# Patient Record
Sex: Female | Born: 1990 | Race: White | Hispanic: No | Marital: Married | Smoking: Former smoker
Health system: Southern US, Community
[De-identification: ages and names within clinical notes are randomized; demographics above are authoritative.]

## PROBLEM LIST (undated history)

## (undated) DIAGNOSIS — F603 Borderline personality disorder: Secondary | ICD-10-CM

## (undated) DIAGNOSIS — N200 Calculus of kidney: Secondary | ICD-10-CM

## (undated) DIAGNOSIS — F99 Mental disorder, not otherwise specified: Secondary | ICD-10-CM

## (undated) DIAGNOSIS — F329 Major depressive disorder, single episode, unspecified: Secondary | ICD-10-CM

## (undated) DIAGNOSIS — F32A Depression, unspecified: Secondary | ICD-10-CM

## (undated) DIAGNOSIS — F419 Anxiety disorder, unspecified: Secondary | ICD-10-CM

## (undated) DIAGNOSIS — R87629 Unspecified abnormal cytological findings in specimens from vagina: Secondary | ICD-10-CM

## (undated) HISTORY — PX: TUBAL LIGATION: SHX77

## (undated) HISTORY — DX: Anxiety disorder, unspecified: F41.9

## (undated) HISTORY — DX: Depression, unspecified: F32.A

## (undated) HISTORY — DX: Major depressive disorder, single episode, unspecified: F32.9

## (undated) HISTORY — DX: Unspecified abnormal cytological findings in specimens from vagina: R87.629

## (undated) HISTORY — PX: APPENDECTOMY: SHX54

## (undated) HISTORY — DX: Mental disorder, not otherwise specified: F99

## (undated) HISTORY — PX: OVARIAN CYST REMOVAL: SHX89

## (undated) HISTORY — PX: LEEP: SHX91

---
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
## 2013-12-11 ENCOUNTER — Emergency Department (HOSPITAL_COMMUNITY)
Admission: EM | Admit: 2013-12-11 | Discharge: 2013-12-11 | Disposition: A | Discharge status: Home / Self Care | Payer: Medicaid Other | Attending: Emergency Medicine | Admitting: Emergency Medicine

## 2013-12-11 ENCOUNTER — Encounter (HOSPITAL_COMMUNITY): Payer: Self-pay | Admitting: Emergency Medicine

## 2013-12-11 DIAGNOSIS — F17200 Nicotine dependence, unspecified, uncomplicated: Secondary | ICD-10-CM | POA: Diagnosis not present

## 2013-12-11 DIAGNOSIS — J029 Acute pharyngitis, unspecified: Secondary | ICD-10-CM | POA: Insufficient documentation

## 2013-12-11 DIAGNOSIS — Z8742 Personal history of other diseases of the female genital tract: Secondary | ICD-10-CM | POA: Insufficient documentation

## 2013-12-11 DIAGNOSIS — J069 Acute upper respiratory infection, unspecified: Secondary | ICD-10-CM | POA: Diagnosis not present

## 2013-12-11 LAB — RAPID STREP SCREEN (MED CTR MEBANE ONLY): Streptococcus, Group A Screen (Direct): NEGATIVE

## 2013-12-11 MED ORDER — IBUPROFEN 600 MG PO TABS: 600.0000 mg | ORAL_TABLET | Freq: Four times a day (QID) | ORAL | Status: DC | PRN

## 2013-12-11 MED ORDER — BENZONATATE 200 MG PO CAPS: 200.0000 mg | ORAL_CAPSULE | Freq: Three times a day (TID) | ORAL | Status: DC | PRN

## 2013-12-11 NOTE — ED Provider Notes (Signed)
CSN: [id number]     Arrival date & time 12/11/13  1547 History    Chief Complaint  Patient presents with  . Sore Throat     HPI HPI Comments: Darlene Mcbride is a 23 y.o. female who presents to the Emergency Department complaining of sore throat for the past 3 days which has progressed to include subjective fevers, nasal congestion with thick nasal discharge from clear to green along with post nasal drip with need for frequent expectoration.  She has had a dry sounding cough and denies sob, chest pain, wheezing.  She has had subjective fever as well.  She has taken dayquill and Nyquil with some transient relief of symptoms. Her infant son also has similar symptoms. To her knowledge she has not been exposed to anyone with strep throat.  History reviewed. No pertinent past medical history. Past Surgical History  Procedure Laterality Date  . Appendectomy    . Ovarian cyst removal     No family history on file. History  Substance Use Topics  . Smoking status: Current Some Day Smoker  . Smokeless tobacco: Not on file  . Alcohol Use: No   OB History   Grav Para Term Preterm Abortions TAB SAB Ect Mult Living                 Review of Systems  Constitutional: Positive for fever and chills.  HENT: Positive for congestion, postnasal drip, rhinorrhea, sinus pressure and sore throat. Negative for ear pain, trouble swallowing and voice change.   Eyes: Negative for discharge.  Respiratory: Positive for cough. Negative for shortness of breath, wheezing and stridor.   Cardiovascular: Negative for chest pain.  Gastrointestinal: Negative for nausea and abdominal pain.  Genitourinary: Negative.       Allergies  Review of patient's allergies indicates no known allergies.  Home Medications   Prior to Admission medications   Medication Sig Start Date End Date Taking? Authorizing Provider  Pseudoephedrine-APAP-DM (DAYQUIL MULTI-SYMPTOM COLD/FLU PO) Take 2 capsules by mouth every 6 (six) hours  as needed. Cold sx   Yes Historical Provider, MD  benzonatate (TESSALON) 200 MG capsule Take 1 capsule (200 mg total) by mouth 3 (three) times daily as needed for cough. 12/11/13   Evalee Jefferson, PA-C  ibuprofen (ADVIL,MOTRIN) 600 MG tablet Take 1 tablet (600 mg total) by mouth every 6 (six) hours as needed (throat pain). 12/11/13   Evalee Jefferson, PA-C   Triage Vitals: BP 136/74  Pulse 99  Temp(Src) 97.9 F (36.6 C)  Resp 16  Ht 5' 4.5" (1.638 m)  Wt 235 lb (106.595 kg)  BMI 39.73 kg/m2  SpO2 99%  LMP 12/04/2013 Physical Exam  Constitutional: She is oriented to person, place, and time. She appears well-developed and well-nourished.  HENT:  Head: Normocephalic and atraumatic.  Right Ear: Tympanic membrane and ear canal normal.  Left Ear: Tympanic membrane and ear canal normal.  Nose: Mucosal edema and rhinorrhea present. Right sinus exhibits no maxillary sinus tenderness and no frontal sinus tenderness. Left sinus exhibits no maxillary sinus tenderness and no frontal sinus tenderness.  Mouth/Throat: Uvula is midline and mucous membranes are normal. Posterior oropharyngeal erythema present. No oropharyngeal exudate, posterior oropharyngeal edema or tonsillar abscesses.  Petechiae on soft palate.  Eyes: Conjunctivae are normal.  Cardiovascular: Normal rate and normal heart sounds.   Pulmonary/Chest: Effort normal. No respiratory distress. She has no wheezes. She has no rales.  Abdominal: Soft. There is no tenderness.  Musculoskeletal: Normal range of motion.  Neurological: She is alert and oriented to person, place, and time.  Skin: Skin is warm and dry. No rash noted.  Psychiatric: She has a normal mood and affect.    ED Course  Procedures (including critical care time)  COORDINATION OF CARE: 6:14 PM- Discussed treatment plan with patient at bedside and patient agreed to plan.    Labs Review Labs Reviewed  RAPID STREP SCREEN  CULTURE, GROUP A STREP    Imaging Review No results  found.   EKG Interpretation None      MDM   Final diagnoses:  Acute URI    Patients labs and/or radiological studies were viewed and considered during the medical decision making and disposition process. Strep negative.  Pt advised rest,  Increased fluid intake, ibuprofen, tessalon.  Prn f/u with pcp.  I personally performed the services described in this documentation, which was scribed in my presence. The recorded information has been reviewed and is accurate.    Evalee Jefferson, PA-C 12/11/13 1818

## 2013-12-11 NOTE — ED Notes (Signed)
Pt c/o sore throat, nasal congestion, cough that is productive at times, unsure of color to mucous not feeling well, chills  Since Thursday,

## 2013-12-11 NOTE — Discharge Instructions (Signed)
Upper Respiratory Infection, Adult An upper respiratory infection (URI) is also sometimes known as the common cold. The upper respiratory tract includes the nose, sinuses, throat, trachea, and bronchi. Bronchi are the airways leading to the lungs. Most people improve within 1 week, but symptoms can last up to 2 weeks. A residual cough may last even longer.  CAUSES Many different viruses can infect the tissues lining the upper respiratory tract. The tissues become irritated and inflamed and often become very moist. Mucus production is also common. A cold is contagious. You can easily spread the virus to others by oral contact. This includes kissing, sharing a glass, coughing, or sneezing. Touching your mouth or nose and then touching a surface, which is then touched by another person, can also spread the virus. SYMPTOMS  Symptoms typically develop 1 to 3 days after you come in contact with a cold virus. Symptoms vary from person to person. They may include:  Runny nose.  Sneezing.  Nasal congestion.  Sinus irritation.  Sore throat.  Loss of voice (laryngitis).  Cough.  Fatigue.  Muscle aches.  Loss of appetite.  Headache.  Low-grade fever. DIAGNOSIS  You might diagnose your own cold based on familiar symptoms, since most people get a cold 2 to 3 times a year. Your caregiver can confirm this based on your exam. Most importantly, your caregiver can check that your symptoms are not due to another disease such as strep throat, sinusitis, pneumonia, asthma, or epiglottitis. Blood tests, throat tests, and X-rays are not necessary to diagnose a common cold, but they may sometimes be helpful in excluding other more serious diseases. Your caregiver will decide if any further tests are required. RISKS AND COMPLICATIONS  You may be at risk for a more severe case of the common cold if you smoke cigarettes, have chronic heart disease (such as heart failure) or lung disease (such as asthma), or if  you have a weakened immune system. The very young and very old are also at risk for more serious infections. Bacterial sinusitis, middle ear infections, and bacterial pneumonia can complicate the common cold. The common cold can worsen asthma and chronic obstructive pulmonary disease (COPD). Sometimes, these complications can require emergency medical care and may be life-threatening. PREVENTION  The best way to protect against getting a cold is to practice good hygiene. Avoid oral or hand contact with people with cold symptoms. Wash your hands often if contact occurs. There is no clear evidence that vitamin C, vitamin E, echinacea, or exercise reduces the chance of developing a cold. However, it is always recommended to get plenty of rest and practice good nutrition. TREATMENT  Treatment is directed at relieving symptoms. There is no cure. Antibiotics are not effective, because the infection is caused by a virus, not by bacteria. Treatment may include:  Increased fluid intake. Sports drinks offer valuable electrolytes, sugars, and fluids.  Breathing heated mist or steam (vaporizer or shower).  Eating chicken soup or other clear broths, and maintaining good nutrition.  Getting plenty of rest.  Using gargles or lozenges for comfort.  Controlling fevers with ibuprofen or acetaminophen as directed by your caregiver.  Increasing usage of your inhaler if you have asthma. Zinc gel and zinc lozenges, taken in the first 24 hours of the common cold, can shorten the duration and lessen the severity of symptoms. Pain medicines may help with fever, muscle aches, and throat pain. A variety of non-prescription medicines are available to treat congestion and runny nose. Your caregiver   can make recommendations and may suggest nasal or lung inhalers for other symptoms.  HOME CARE INSTRUCTIONS   Only take over-the-counter or prescription medicines for pain, discomfort, or fever as directed by your  caregiver.  Use a warm mist humidifier or inhale steam from a shower to increase air moisture. This may keep secretions moist and make it easier to breathe.  Drink enough water and fluids to keep your urine clear or pale yellow.  Rest as needed.  Return to work when your temperature has returned to normal or as your caregiver advises. You may need to stay home longer to avoid infecting others. You can also use a face mask and careful hand washing to prevent spread of the virus. SEEK MEDICAL CARE IF:   After the first few days, you feel you are getting worse rather than better.  You need your caregiver's advice about medicines to control symptoms.  You develop chills, worsening shortness of breath, or brown or red sputum. These may be signs of pneumonia.  You develop yellow or brown nasal discharge or pain in the face, especially when you bend forward. These may be signs of sinusitis.  You develop a fever, swollen neck glands, pain with swallowing, or white areas in the back of your throat. These may be signs of strep throat. SEEK IMMEDIATE MEDICAL CARE IF:   You have a fever.  You develop severe or persistent headache, ear pain, sinus pain, or chest pain.  You develop wheezing, a prolonged cough, cough up blood, or have a change in your usual mucus (if you have chronic lung disease).  You develop sore muscles or a stiff neck. Document Released: 10/22/2000 Document Revised: 07/21/2011 Document Reviewed: 08/03/2013 Exit[employer] Patient Information 2015 [employer]. This information is not intended to replace advice given to you by your health care provider. Make sure you discuss any questions you have with your health care provider.  

## 2013-12-12 NOTE — ED Provider Notes (Signed)
Medical screening examination/treatment/procedure(s) were performed by non-physician practitioner and as supervising physician I was immediately available for consultation/collaboration.   EKG Interpretation None       Babette Relic, MD 12/12/13 1210

## 2013-12-14 LAB — CULTURE, GROUP A STREP

## 2013-12-15 ENCOUNTER — Telehealth (HOSPITAL_BASED_OUTPATIENT_CLINIC_OR_DEPARTMENT_OTHER): Payer: Self-pay | Admitting: Emergency Medicine

## 2013-12-15 NOTE — Telephone Encounter (Signed)
Post ED Visit - Positive Culture Follow-up: Successful Patient Follow-Up  Culture assessed and recommendations reviewed by: []  [facility] [place], Pharm.D., BCPS []  Heide Guile, Pharm.D., BCPS []  Alycia Rossetti, Pharm.D., BCPS []  [place], Pharm.D., BCPS, AAHIVP []  Legrand Como, Pharm.D., BCPS, AAHIVP []  Hassie Bruce, Pharm.D. [x]  Milus Glazier, Pharm.D.  Positive strep throat culture  [x]  Patient discharged without antimicrobial prescription and treatment is now indicated []  Organism is resistant to prescribed ED discharge antimicrobial []  Patient with positive blood cultures  Changes discussed with ED provider: [facility] New antibiotic prescription Amoxicillin 500mg  po bid x 10 days Called to [place] patient, date 12/15/13, time 1538   Hazle Nordmann 12/15/2013, 3:37 PM

## 2013-12-15 NOTE — Progress Notes (Signed)
ED Antimicrobial Stewardship Positive Culture Follow Up   Darlene Mcbride is an 23 y.o. female who presented to [facility] on 12/11/2013 with a chief complaint of  Chief Complaint  Patient presents with  . Sore Throat    Recent Results (from the past 720 hour(s))  RAPID STREP SCREEN     Status: None   Collection Time    12/11/13  5:15 PM      Result Value Ref Range Status   Streptococcus, Group A Screen (Direct) NEGATIVE  NEGATIVE Final   Comment: (NOTE)     A Rapid Antigen test may result negative if the antigen level in the     sample is below the detection level of this test. The FDA has not     cleared this test as a stand-alone test therefore the rapid antigen     negative result has reflexed to a Group A Strep culture.  CULTURE, GROUP A STREP     Status: None   Collection Time    12/11/13  5:15 PM      Result Value Ref Range Status   Specimen Description THROAT   Final   Special Requests NONE   Final   Culture     Final   Value: GROUP A STREP (S.PYOGENES) ISOLATED     Performed at [facility]   Report Status 12/14/2013 FINAL   Final    [x]  Patient discharged originally without antimicrobial agent and treatment is now indicated  New antibiotic prescription: amoxicillin 500 mg PO BID x 10 days.  ED Provider: Delos Haring, PA-C   Nicole Kindred, Gillian Shields 12/15/2013, 11:53 AM Infectious Diseases Pharmacist Phone# [phone]

## 2014-07-23 ENCOUNTER — Emergency Department (HOSPITAL_COMMUNITY)
Admission: EM | Admit: 2014-07-23 | Discharge: 2014-07-24 | Disposition: A | Discharge status: Home / Self Care | Payer: Self-pay | Attending: Emergency Medicine | Admitting: Emergency Medicine

## 2014-07-23 ENCOUNTER — Emergency Department (HOSPITAL_COMMUNITY): Payer: Medicaid Other

## 2014-07-23 ENCOUNTER — Encounter (HOSPITAL_COMMUNITY): Payer: Self-pay | Admitting: Emergency Medicine

## 2014-07-23 DIAGNOSIS — Z3490 Encounter for supervision of normal pregnancy, unspecified, unspecified trimester: Secondary | ICD-10-CM

## 2014-07-23 DIAGNOSIS — N390 Urinary tract infection, site not specified: Secondary | ICD-10-CM

## 2014-07-23 DIAGNOSIS — O9989 Other specified diseases and conditions complicating pregnancy, childbirth and the puerperium: Secondary | ICD-10-CM | POA: Insufficient documentation

## 2014-07-23 DIAGNOSIS — Z7952 Long term (current) use of systemic steroids: Secondary | ICD-10-CM | POA: Insufficient documentation

## 2014-07-23 DIAGNOSIS — Z3A00 Weeks of gestation of pregnancy not specified: Secondary | ICD-10-CM | POA: Insufficient documentation

## 2014-07-23 DIAGNOSIS — Z720 Tobacco use: Secondary | ICD-10-CM | POA: Insufficient documentation

## 2014-07-23 DIAGNOSIS — R42 Dizziness and giddiness: Secondary | ICD-10-CM | POA: Insufficient documentation

## 2014-07-23 DIAGNOSIS — R1031 Right lower quadrant pain: Secondary | ICD-10-CM | POA: Insufficient documentation

## 2014-07-23 DIAGNOSIS — O2341 Unspecified infection of urinary tract in pregnancy, first trimester: Secondary | ICD-10-CM | POA: Insufficient documentation

## 2014-07-23 DIAGNOSIS — R51 Headache: Secondary | ICD-10-CM | POA: Insufficient documentation

## 2014-07-23 DIAGNOSIS — Z9049 Acquired absence of other specified parts of digestive tract: Secondary | ICD-10-CM | POA: Insufficient documentation

## 2014-07-23 LAB — COMPREHENSIVE METABOLIC PANEL
ALBUMIN: 3.9 g/dL (ref 3.5–5.2)
ALK PHOS: 58 U/L (ref 39–117)
ALT: 20 U/L (ref 0–35)
ANION GAP: 6 (ref 5–15)
AST: 17 U/L (ref 0–37)
BUN: 10 mg/dL (ref 6–23)
CALCIUM: 8.8 mg/dL (ref 8.4–10.5)
CO2: 23 mmol/L (ref 19–32)
CREATININE: 0.53 mg/dL (ref 0.50–1.10)
Chloride: 107 mmol/L (ref 96–112)
GFR calc Af Amer: >90 mL/min (ref >90)
GLUCOSE: 93 mg/dL (ref 70–99)
POTASSIUM: 3.5 mmol/L (ref 3.5–5.1)
Sodium: 136 mmol/L (ref 135–145)
TOTAL PROTEIN: 7.2 g/dL (ref 6.0–8.3)
Total Bilirubin: 0.3 mg/dL (ref 0.3–1.2)

## 2014-07-23 LAB — URINALYSIS, ROUTINE W REFLEX MICROSCOPIC
BILIRUBIN URINE: NEGATIVE
GLUCOSE, UA: NEGATIVE mg/dL
HGB URINE DIPSTICK: NEGATIVE
Ketones, ur: NEGATIVE mg/dL
LEUKOCYTES UA: NEGATIVE
NITRITE: POSITIVE — AB
PH: 6.5 (ref 5.0–8.0)
PROTEIN: NEGATIVE mg/dL
Urobilinogen, UA: 0.2 mg/dL (ref 0.0–1.0)

## 2014-07-23 LAB — CBC WITH DIFFERENTIAL/PLATELET
BASOS ABS: 0 10*3/uL (ref 0.0–0.1)
Basophils Relative: 0 % (ref 0–1)
Eosinophils Absolute: 0.4 10*3/uL (ref 0.0–0.7)
Eosinophils Relative: 3 % (ref 0–5)
HEMATOCRIT: 36.3 % (ref 36.0–46.0)
Hemoglobin: 12.2 g/dL (ref 12.0–15.0)
LYMPHS ABS: 4.3 10*3/uL — AB (ref 0.7–4.0)
LYMPHS PCT: 31 % (ref 12–46)
MCH: 31.5 pg (ref 26.0–34.0)
MCHC: 33.6 g/dL (ref 30.0–36.0)
MCV: 93.8 fL (ref 78.0–100.0)
Monocytes Absolute: 0.9 10*3/uL (ref 0.1–1.0)
Monocytes Relative: 6 % (ref 3–12)
NEUTROS ABS: 8.3 10*3/uL — AB (ref 1.7–7.7)
NEUTROS PCT: 60 % (ref 43–77)
Platelets: 336 10*3/uL (ref 150–400)
RBC: 3.87 MIL/uL (ref 3.87–5.11)
RDW: 14.3 % (ref 11.5–15.5)
WBC: 13.9 10*3/uL — AB (ref 4.0–10.5)

## 2014-07-23 LAB — URINE MICROSCOPIC-ADD ON

## 2014-07-23 LAB — PREGNANCY, URINE: PREG TEST UR: POSITIVE — AB

## 2014-07-23 LAB — HCG, QUANTITATIVE, PREGNANCY: HCG, BETA CHAIN, QUANT, S: 135 m[IU]/mL — AB (ref <5)

## 2014-07-23 MED ORDER — ACETAMINOPHEN 325 MG PO TABS
650.0000 mg | ORAL_TABLET | Freq: Once | ORAL | Status: AC
  Administered 2014-07-23: 650 mg via ORAL
  Filled 2014-07-23: qty 2

## 2014-07-23 MED ORDER — CEPHALEXIN 500 MG PO CAPS: 500.0000 mg | ORAL_CAPSULE | Freq: Two times a day (BID) | ORAL | Status: DC

## 2014-07-23 NOTE — ED Notes (Signed)
Pt denies any bleeding or vaginal discharge.

## 2014-07-23 NOTE — ED Provider Notes (Addendum)
CSN: [id number]     Arrival date & time 07/23/14  7619 History   This chart was scribed for Elnora Morrison, MD by Chester Holstein, ED Scribe. This patient was seen in room APA16A/APA16A and the patient's care was started at 7:31 PM.    Chief Complaint  Patient presents with  . Abdominal Pain     Patient is a 24 y.o. female presenting with abdominal pain. The history is provided by the patient. No language interpreter was used.  Abdominal Pain Associated symptoms: no vaginal bleeding    HPI Comments: Darlene Mcbride is a 24 y.o. female who presents to the Emergency Department complaining of stabbing RLQ abdominal pain with onset a week ago. Pt notes dizziness and headache. Pt with h/o appendectomy, ovarian cyst removal, and renal calculi. She denies similar pain in past.  Pt states last menstrual period was 06/21/14. Pt states she took a home pregnancy test 2 days ago with positive results but she is unsure of how many weeks along. Pt is G3 P2. Pt denies flank pain and vaginal bleeding.   History reviewed. No pertinent past medical history. Past Surgical History  Procedure Laterality Date  . Appendectomy    . Ovarian cyst removal     No family history on file. History  Substance Use Topics  . Smoking status: Current Some Day Smoker  . Smokeless tobacco: Not on file  . Alcohol Use: No   OB History    Gravida Para Term Preterm AB TAB SAB Ectopic Multiple Living   1              Review of Systems  Gastrointestinal: Positive for abdominal pain.  Genitourinary: Negative for flank pain and vaginal bleeding.  Neurological: Positive for dizziness and headaches.  All other systems reviewed and are negative.     Allergies  Review of patient's allergies indicates no known allergies.  Home Medications   Prior to Admission medications   Medication Sig Start Date End Date Taking? Authorizing Provider  flintstones complete (FLINTSTONES) 60 MG chewable tablet Chew 1 tablet by mouth  daily.   Yes Historical Provider, MD  benzonatate (TESSALON) 200 MG capsule Take 1 capsule (200 mg total) by mouth 3 (three) times daily as needed for cough. Patient not taking: Reported on 07/23/2014 12/11/13   Evalee Jefferson, PA-C  cephALEXin (KEFLEX) 500 MG capsule Take 1 capsule (500 mg total) by mouth 2 (two) times daily. 07/23/14   Elnora Morrison, MD  ibuprofen (ADVIL,MOTRIN) 600 MG tablet Take 1 tablet (600 mg total) by mouth every 6 (six) hours as needed (throat pain). Patient not taking: Reported on 07/23/2014 12/11/13   Evalee Jefferson, PA-C   BP 131/66 mmHg  Pulse 86  Temp(Src) 98.1 F (36.7 C) (Oral)  Resp 24  Ht 5\' 4"  (1.626 m)  Wt 229 lb 8 oz (104.101 kg)  BMI 39.37 kg/m2  SpO2 100%  LMP 06/21/2014 (Approximate) Physical Exam  Constitutional: She is oriented to person, place, and time. She appears well-developed and well-nourished.  HENT:  Head: Normocephalic.  Eyes: Conjunctivae are normal.  Neck: Normal range of motion. Neck supple.  Pulmonary/Chest: Effort normal.  Abdominal: There is tenderness in the suprapubic area. There is no rigidity and no guarding.  No peritonitis  Musculoskeletal: Normal range of motion.  Neurological: She is alert and oriented to person, place, and time.  Skin: Skin is warm and dry.  Psychiatric: She has a normal mood and affect. Her behavior is normal.  Nursing note  and vitals reviewed.   ED Course  Procedures (including critical care time) Emergency Focused Ultrasound Exam Limited retroperitoneal ultrasound of kidneys  Performed and interpreted by Dr. Reather Converse Indication: flank pain Focused abdominal ultrasound with both kidneys imaged in transverse and longitudinal planes in real-time. Interpretation: No hydronephrosis visualized.   Images archived electronically  EMERGENCY DEPARTMENT [place] PREGNANCY "Study: Limited Ultrasound of the Pelvis for Pregnancy"  INDICATIONS:Pregnancy(required) Multiple views of the uterus and pelvic cavity were  obtained in real-time with a multi-frequency probe.  APPROACH:Transabdominal   PERFORMED BY: Myself  IMAGES ARCHIVED?: Yes  LIMITATIONS: Body habitus  PREGNANCY FREE FLUID: None  PREGNANCY FINDINGS: No yolk sac noted and No fetal pole seen  INTERPRETATION: No visualized intrauterine pregnancy      DIAGNOSTIC STUDIES: Oxygen Saturation is 100% on room air, normal by my interpretation.    COORDINATION OF CARE: 7:42 PM Discussed treatment plan with patient at beside, the patient agrees with the plan and has no further questions at this time.   Labs Review Labs Reviewed  URINALYSIS, ROUTINE W REFLEX MICROSCOPIC - Abnormal; Notable for the following:    APPearance HAZY (*)    Specific Gravity, Urine >1.030 (*)    Nitrite POSITIVE (*)    All other components within normal limits  CBC WITH DIFFERENTIAL/PLATELET - Abnormal; Notable for the following:    WBC 13.9 (*)    Neutro Abs 8.3 (*)    Lymphs Abs 4.3 (*)    All other components within normal limits  HCG, QUANTITATIVE, PREGNANCY - Abnormal; Notable for the following:    hCG, Beta Chain, Quant, S 135 (*)    All other components within normal limits  PREGNANCY, URINE - Abnormal; Notable for the following:    Preg Test, Ur POSITIVE (*)    All other components within normal limits  URINE MICROSCOPIC-ADD ON - Abnormal; Notable for the following:    Squamous Epithelial / LPF FEW (*)    Bacteria, UA MANY (*)    All other components within normal limits  URINE CULTURE  COMPREHENSIVE METABOLIC PANEL    Imaging Review US Ob Comp Less 14 Wks  07/23/2014   CLINICAL DATA:  Right lower quadrant pain. Positive pregnancy test. No vaginal bleeding. Quantitative beta HCG unknown. Estimated gestational age per LMP 4 weeks 4 days.  EXAM: OBSTETRIC <14 WK [place] AND TRANSVAGINAL OB US  TECHNIQUE: Both transabdominal and transvaginal ultrasound examinations were performed for complete evaluation of the gestation as well as the maternal  uterus, adnexal regions, and pelvic cul-de-sac. Transvaginal technique was performed to assess early pregnancy.  COMPARISON:  None.  FINDINGS: Intrauterine gestational sac: Not visualized.  Yolk sac:  Not visualized.  Embryo:  Not visualized.  Cardiac Activity: Not visualized.  Heart Rate: Not visualized.  Maternal uterus/adnexae: Uterus is normal in size, shape position. Endometrium measures approximately 11 mm in thickness containing a mild amount of minimally complex fluid. Ovaries are are within normal with normal vascular flow. No significant free pelvic fluid.  IMPRESSION: No evidence of intrauterine gestation. Small amount of complex fluid over the uterine cavity. No other findings to suggest ectopic pregnancy. Recommend correlation with patient's quantitative beta HCG as well as serial quantitative beta HCG and follow-up ultrasound as clinically indicated.   Electronically Signed   By: Marin Olp M.D.   On: 07/23/2014 21:20   US Ob Transvaginal  07/23/2014   CLINICAL DATA:  Right lower quadrant pain. Positive pregnancy test. No vaginal bleeding. Quantitative beta HCG unknown. Estimated gestational  age per LMP 4 weeks 4 days.  EXAM: OBSTETRIC <14 WK [place] AND TRANSVAGINAL OB US  TECHNIQUE: Both transabdominal and transvaginal ultrasound examinations were performed for complete evaluation of the gestation as well as the maternal uterus, adnexal regions, and pelvic cul-de-sac. Transvaginal technique was performed to assess early pregnancy.  COMPARISON:  None.  FINDINGS: Intrauterine gestational sac: Not visualized.  Yolk sac:  Not visualized.  Embryo:  Not visualized.  Cardiac Activity: Not visualized.  Heart Rate: Not visualized.  Maternal uterus/adnexae: Uterus is normal in size, shape position. Endometrium measures approximately 11 mm in thickness containing a mild amount of minimally complex fluid. Ovaries are are within normal with normal vascular flow. No significant free pelvic fluid.  IMPRESSION: No  evidence of intrauterine gestation. Small amount of complex fluid over the uterine cavity. No other findings to suggest ectopic pregnancy. Recommend correlation with patient's quantitative beta HCG as well as serial quantitative beta HCG and follow-up ultrasound as clinically indicated.   Electronically Signed   By: Marin Olp M.D.   On: 07/23/2014 21:20     EKG Interpretation None      7:38 PM No intrauterine pregnancy seen with bedside US  MDM   Final diagnoses:  Abdominal pain, right lower quadrant  Pregnant  UTI (lower urinary tract infection)   Patient with new onset pregnancy unsure last menstrual cycle irregularity presents with abdominal pain right lower quadrant intermittent sharp and recent pregnancy. Bedside ultrasound unable to identify intrauterine pregnancy. Benign abdominal exam without peritonitis. Patient has had her appendix removed in the past. No vaginal symptoms no vaginal bleeding. Patient has had kidney stones before, no hydronephrosis seen, urinalysis pending. Tylenol for pain  Ultrasound tech called in for formal transvaginal ultrasound. Likely UTI, keflex and outpt fup Reviewed radiology results, no intrauterine pregnancy, no obvious ectopic pregnancy at this time. Patient's vitals stable, hemoglobin stable, well-appearing, patient stable for close outpatient follow-up in 2 days with OB/GYN for reassessment. Results and differential diagnosis were discussed with the patient/parent/guardian. Close follow up outpatient was discussed, comfortable with the plan.   Medications  acetaminophen (TYLENOL) tablet 650 mg (650 mg Oral Given 07/23/14 1946)    Filed Vitals:   07/23/14 1859  BP: 131/66  Pulse: 86  Temp: 98.1 F (36.7 C)  TempSrc: Oral  Resp: 24  Height: 5\' 4"  (1.626 m)  Weight: 229 lb 8 oz (104.101 kg)  SpO2: 100%    Final diagnoses:  Abdominal pain, right lower quadrant  Pregnant  UTI (lower urinary tract infection)      Elnora Morrison,  MD 07/23/14 2158  Elnora Morrison, MD 07/23/14 2216

## 2014-07-23 NOTE — ED Notes (Addendum)
Patient c/o right lower abd pain x6 days with nausea, dizziness, and headaches. Patient states she took pregnancy test Friday was positive. Per patient last period 06/21/2014. Denies any bleeding. Per patient has 2 prior pregnancy with no complications and patient states "I had lower abd pain with them but not like this."

## 2014-07-23 NOTE — Discharge Instructions (Signed)
If you were given medicines take as directed.  If you are on coumadin or contraceptives realize their levels and effectiveness is altered by many different medicines.  If you have any reaction (rash, tongues swelling, other) to the medicines stop taking and see a physician.   It is very important for you to follow-up with OB/GYN Monday or Tuesday for further evaluation of abnormal pregnancy. If you develop vaginal bleeding, passing out, worsening pain or other symptoms return to the ER if not follow closely with obstetrics. Please follow up as directed and return to the ER or see a physician for new or worsening symptoms such as fever, bleeding, other.  Thank you. Filed Vitals:   07/23/14 1859  BP: 131/66  Pulse: 86  Temp: 98.1 F (36.7 C)  TempSrc: Oral  Resp: 24  Height: 5\' 4"  (1.626 m)  Weight: 229 lb 8 oz (104.101 kg)  SpO2: 100%    Abdominal Pain During Pregnancy Abdominal pain is common in pregnancy. Most of the time, it does not cause harm. There are many causes of abdominal pain. Some causes are more serious than others. Some of the causes of abdominal pain in pregnancy are easily diagnosed. Occasionally, the diagnosis takes time to understand. Other times, the cause is not determined. Abdominal pain can be a sign that something is very wrong with the pregnancy, or the pain may have nothing to do with the pregnancy at all. For this reason, always tell your health care provider if you have any abdominal discomfort. HOME CARE INSTRUCTIONS  Monitor your abdominal pain for any changes. The following actions may help to alleviate any discomfort you are experiencing:  Do not have sexual intercourse or put anything in your vagina until your symptoms go away completely.  Get plenty of rest until your pain improves.  Drink clear fluids if you feel nauseous. Avoid solid food as long as you are uncomfortable or nauseous.  Only take over-the-counter or prescription medicine as directed by  your health care provider.  Keep all follow-up appointments with your health care provider. SEEK IMMEDIATE MEDICAL CARE IF:  You are bleeding, leaking fluid, or passing tissue from the vagina.  You have increasing pain or cramping.  You have persistent vomiting.  You have painful or bloody urination.  You have a fever.  You notice a decrease in your baby's movements.  You have extreme weakness or feel faint.  You have shortness of breath, with or without abdominal pain.  You develop a severe headache with abdominal pain.  You have abnormal vaginal discharge with abdominal pain.  You have persistent diarrhea.  You have abdominal pain that continues even after rest, or gets worse. MAKE SURE YOU:   Understand these instructions.  Will watch your condition.  Will get help right away if you are not doing well or get worse. Document Released: 04/28/2005 Document Revised: 02/16/2013 Document Reviewed: 11/25/2012 [facility] Patient Information 2015 [place], [place]. This information is not intended to replace advice given to you by your health care provider. Make sure you discuss any questions you have with your health care provider.

## 2014-07-26 LAB — URINE CULTURE: Colony Count: >=100000

## 2014-07-27 ENCOUNTER — Telehealth (HOSPITAL_BASED_OUTPATIENT_CLINIC_OR_DEPARTMENT_OTHER): Payer: Self-pay | Admitting: Emergency Medicine

## 2014-07-27 NOTE — Telephone Encounter (Signed)
Post ED Visit - Positive Culture Follow-up  Culture report reviewed by antimicrobial stewardship pharmacist: []  Wes Dulaney, Pharm.D., BCPS []  Heide Guile, Pharm.D., BCPS []  Alycia Rossetti, Pharm.D., BCPS []  [place], Pharm.D., BCPS, AAHIVP [x]  Legrand Como, Pharm.D., BCPS, AAHIVP []  Isac Sarna, Pharm.D., BCPS  Positive urine culture Klebsiella Treated with cephalexin, organism sensitive to the same and no further patient follow-up is required at this time.  Hazle Nordmann 07/27/2014, 3:20 PM

## 2015-04-02 ENCOUNTER — Encounter (HOSPITAL_COMMUNITY): Payer: Self-pay

## 2015-04-02 ENCOUNTER — Emergency Department (HOSPITAL_COMMUNITY)
Admission: EM | Admit: 2015-04-02 | Discharge: 2015-04-03 | Disposition: A | Discharge status: Home / Self Care | Payer: Medicaid Other | Attending: Emergency Medicine | Admitting: Emergency Medicine

## 2015-04-02 DIAGNOSIS — F17200 Nicotine dependence, unspecified, uncomplicated: Secondary | ICD-10-CM | POA: Insufficient documentation

## 2015-04-02 DIAGNOSIS — Z79899 Other long term (current) drug therapy: Secondary | ICD-10-CM | POA: Insufficient documentation

## 2015-04-02 DIAGNOSIS — G4489 Other headache syndrome: Secondary | ICD-10-CM | POA: Insufficient documentation

## 2015-04-02 MED ORDER — SODIUM CHLORIDE 0.9 % IV SOLN
INTRAVENOUS | Status: AC
  Administered 2015-04-02: via INTRAVENOUS

## 2015-04-02 MED ORDER — METOCLOPRAMIDE HCL 5 MG/ML IJ SOLN
10.0000 mg | Freq: Once | INTRAMUSCULAR | Status: AC
  Administered 2015-04-02: 10 mg via INTRAVENOUS
  Filled 2015-04-02: qty 2

## 2015-04-02 MED ORDER — DIPHENHYDRAMINE HCL 50 MG/ML IJ SOLN
25.0000 mg | Freq: Once | INTRAMUSCULAR | Status: AC
  Administered 2015-04-02: 25 mg via INTRAVENOUS
  Filled 2015-04-02: qty 1

## 2015-04-02 MED ORDER — DEXAMETHASONE SODIUM PHOSPHATE 4 MG/ML IJ SOLN
10.0000 mg | Freq: Once | INTRAMUSCULAR | Status: AC
  Administered 2015-04-02: 10 mg via INTRAVENOUS
  Filled 2015-04-02: qty 3

## 2015-04-02 NOTE — ED Notes (Signed)
C/o headache X3 days with nausea and photophobia

## 2015-04-02 NOTE — ED Provider Notes (Signed)
CSN: [id number]     Arrival date & time 04/02/15  2131 History   First MD Initiated Contact with Patient 04/02/15 2304     Chief Complaint  Patient presents with  . Headache     (Consider location/radiation/quality/duration/timing/severity/associated sxs/prior Treatment) Patient is a 24 y.o. female presenting with headaches. The history is provided by the patient.  Headache Pain location:  L temporal and R temporal Quality: throbbing. Severity currently:  7/10 Severity at highest:  10/10 Onset quality:  Gradual Duration:  3 days Timing:  Constant Progression since onset: gets better then bad again. Chronicity:  New Relieved by:  Nothing Worsened by:  Light and sound Ineffective treatments:  NSAIDs and resting in a darkened room Associated symptoms: facial pain, nausea and vomiting    Darlene Mcbride is a 24 y.o. female who presents to the ED with headache, n/v that started 3 days ago. Patient reports that she had a SVD without problems 8 days ago followed by a BTL. She had an epidural for each. She has been taking Percocet and ibuprofen since the BTL. She ran out of the percocet yesterday but they were not helping the headache. The headache is located bilateral temporal area and sometimes radiates to the orbits. Patient reports n/v/d. She reports having headaches in the past but never this bad. She is breast feeding.   History reviewed. No pertinent past medical history. Past Surgical History  Procedure Laterality Date  . Appendectomy    . Ovarian cyst removal    . Tubal ligation     History reviewed. No pertinent family history. Social History  Substance Use Topics  . Smoking status: Current Some Day Smoker  . Smokeless tobacco: None  . Alcohol Use: No   OB History    Gravida Para Term Preterm AB TAB SAB Ectopic Multiple Living   1              Review of Systems  Gastrointestinal: Positive for nausea and vomiting.  Neurological: Positive for headaches.  all other  systems negative    Allergies  Contrast media  Home Medications   Prior to Admission medications   Medication Sig Start Date End Date Taking? Authorizing Provider  ibuprofen (ADVIL,MOTRIN) 400 MG tablet Take 400 mg by mouth every 6 (six) hours as needed for mild pain or moderate pain.   Yes Historical Provider, MD  oxyCODONE-acetaminophen (PERCOCET/ROXICET) 5-325 MG tablet Take 1 tablet by mouth every 4 (four) hours as needed for moderate pain or severe pain.   Yes Historical Provider, MD  Prenatal Vit-Fe Fumarate-FA (PRENATAL MULTIVITAMIN) TABS tablet Take 1 tablet by mouth daily at 12 noon.   Yes Historical Provider, MD   BP 124/58 mmHg  Pulse 76  Temp(Src) 97.5 F (36.4 C) (Oral)  Resp 16  Ht 5\' 5"  (1.651 m)  Wt 104.327 kg  BMI 38.27 kg/m2  SpO2 100%  LMP 04/02/2015  Breastfeeding? Unknown Physical Exam  Constitutional: She is oriented to person, place, and time. She appears well-developed and well-nourished. No distress.  HENT:  Head: Normocephalic and atraumatic.  Right Ear: Tympanic membrane normal.  Left Ear: Tympanic membrane normal.  Nose: Nose normal.  Mouth/Throat: Uvula is midline, oropharynx is clear and moist and mucous membranes are normal.  Eyes: Conjunctivae and EOM are normal.  Neck: Normal range of motion. Neck supple.  Cardiovascular: Normal rate and regular rhythm.   Pulmonary/Chest: Effort normal. She has no wheezes. She has no rales.  Abdominal: Soft. Bowel sounds are normal.  She exhibits no mass. There is no tenderness.  Musculoskeletal: She exhibits no edema.  Radial and pedal pulses strong, adequate circulation, good touch sensation.  Neurological: She is alert and oriented to person, place, and time. She has normal strength. No cranial nerve deficit or sensory deficit. She displays a negative Romberg sign. Gait normal.  Reflex Scores:      Bicep reflexes are 2+ on the right side and 2+ on the left side.      Brachioradialis reflexes are 2+ on the  right side and 2+ on the left side.      Patellar reflexes are 2+ on the right side and 2+ on the left side.      Achilles reflexes are 2+ on the right side and 2+ on the left side. Rapid alternating movement without difficulty. Stands on one foot without difficulty.  Psychiatric: She has a normal mood and affect. Her behavior is normal.  Nursing note and vitals reviewed.   ED Course  Procedures  IV fluids, Decadron 10 mg, Reglan 10 mg, Benadryl 25 mg IV  After medication and fluids patient reports feeling better, she no longer has pain in the temporal area she only feels tightness in the neck muscles.  Flexeril 10 mg PO given and will observe  MDM  24 y.o. s/p SVD 8 days ago with headache that started in the temporal area and radiated to the neck muscles. Stable for d/c with no focal neuro deficits. Symptoms improved with treatment. Discussed with the patient and all questioned fully answered. She will call her doctor in the morning to discuss follow up. She will return here sooner if any problems arise.   Final diagnoses:  Other headache syndrome       Ashley Murrain, NP 04/03/15 [id number]  Julianne Rice, MD 04/03/15 [phone]

## 2015-04-02 NOTE — ED Notes (Signed)
Pt updated on plan of care, reports that she had a vaginally delivery a week ago at [facility], denies any complications,

## 2015-04-03 ENCOUNTER — Emergency Department (HOSPITAL_COMMUNITY)
Admission: EM | Admit: 2015-04-03 | Discharge: 2015-04-03 | Disposition: A | Discharge status: Home / Self Care | Payer: Medicaid - Out of State | Attending: Emergency Medicine | Admitting: Emergency Medicine

## 2015-04-03 ENCOUNTER — Encounter (HOSPITAL_COMMUNITY): Payer: Self-pay

## 2015-04-03 ENCOUNTER — Emergency Department (HOSPITAL_COMMUNITY): Payer: Medicaid - Out of State

## 2015-04-03 ENCOUNTER — Other Ambulatory Visit: Payer: Self-pay | Admitting: Emergency Medicine

## 2015-04-03 DIAGNOSIS — IMO0001 Reserved for inherently not codable concepts without codable children: Secondary | ICD-10-CM

## 2015-04-03 DIAGNOSIS — R001 Bradycardia, unspecified: Secondary | ICD-10-CM | POA: Diagnosis not present

## 2015-04-03 DIAGNOSIS — Z87891 Personal history of nicotine dependence: Secondary | ICD-10-CM | POA: Insufficient documentation

## 2015-04-03 DIAGNOSIS — M542 Cervicalgia: Secondary | ICD-10-CM | POA: Insufficient documentation

## 2015-04-03 DIAGNOSIS — R112 Nausea with vomiting, unspecified: Secondary | ICD-10-CM | POA: Diagnosis not present

## 2015-04-03 DIAGNOSIS — R42 Dizziness and giddiness: Secondary | ICD-10-CM | POA: Diagnosis not present

## 2015-04-03 DIAGNOSIS — R030 Elevated blood-pressure reading, without diagnosis of hypertension: Secondary | ICD-10-CM | POA: Diagnosis not present

## 2015-04-03 DIAGNOSIS — G971 Other reaction to spinal and lumbar puncture: Secondary | ICD-10-CM

## 2015-04-03 DIAGNOSIS — Z79899 Other long term (current) drug therapy: Secondary | ICD-10-CM | POA: Diagnosis not present

## 2015-04-03 DIAGNOSIS — R51 Headache: Secondary | ICD-10-CM | POA: Diagnosis present

## 2015-04-03 LAB — URINALYSIS, ROUTINE W REFLEX MICROSCOPIC
BILIRUBIN URINE: NEGATIVE
GLUCOSE, UA: NEGATIVE mg/dL
KETONES UR: NEGATIVE mg/dL
Leukocytes, UA: NEGATIVE
Nitrite: NEGATIVE
PROTEIN: NEGATIVE mg/dL
Specific Gravity, Urine: 1.015 (ref 1.005–1.030)
pH: 7.5 (ref 5.0–8.0)

## 2015-04-03 LAB — CBC WITH DIFFERENTIAL/PLATELET
Basophils Absolute: 0 10*3/uL (ref 0.0–0.1)
Basophils Relative: 0 %
EOS ABS: 0 10*3/uL (ref 0.0–0.7)
EOS PCT: 0 %
HCT: 32.6 % — ABNORMAL LOW (ref 36.0–46.0)
Hemoglobin: 11.3 g/dL — ABNORMAL LOW (ref 12.0–15.0)
LYMPHS ABS: 1.1 10*3/uL (ref 0.7–4.0)
LYMPHS PCT: 13 %
MCH: 32.4 pg (ref 26.0–34.0)
MCHC: 34.7 g/dL (ref 30.0–36.0)
MCV: 93.4 fL (ref 78.0–100.0)
MONO ABS: 0.1 10*3/uL (ref 0.1–1.0)
MONOS PCT: 1 %
Neutro Abs: 7.4 10*3/uL (ref 1.7–7.7)
Neutrophils Relative %: 86 %
PLATELETS: 398 10*3/uL (ref 150–400)
RBC: 3.49 MIL/uL — ABNORMAL LOW (ref 3.87–5.11)
RDW: 13.1 % (ref 11.5–15.5)
WBC: 8.6 10*3/uL (ref 4.0–10.5)

## 2015-04-03 LAB — COMPREHENSIVE METABOLIC PANEL
ALT: 17 U/L (ref 14–54)
ANION GAP: 10 (ref 5–15)
AST: 14 U/L — ABNORMAL LOW (ref 15–41)
Albumin: 3.5 g/dL (ref 3.5–5.0)
Alkaline Phosphatase: 85 U/L (ref 38–126)
BUN: 10 mg/dL (ref 6–20)
CALCIUM: 8.5 mg/dL — AB (ref 8.9–10.3)
CHLORIDE: 107 mmol/L (ref 101–111)
CO2: 22 mmol/L (ref 22–32)
CREATININE: 0.56 mg/dL (ref 0.44–1.00)
Glucose, Bld: 122 mg/dL — ABNORMAL HIGH (ref 65–99)
Potassium: 4 mmol/L (ref 3.5–5.1)
SODIUM: 139 mmol/L (ref 135–145)
Total Bilirubin: 0.5 mg/dL (ref 0.3–1.2)
Total Protein: 7.2 g/dL (ref 6.5–8.1)

## 2015-04-03 LAB — URINE MICROSCOPIC-ADD ON
Bacteria, UA: NONE SEEN
WBC, UA: NONE SEEN WBC/hpf (ref 0–5)

## 2015-04-03 LAB — PROTEIN / CREATININE RATIO, URINE
Creatinine, Urine: 25.6 mg/dL
PROTEIN CREATININE RATIO: 0.27 mg/mg{creat} — AB (ref 0.00–0.15)
Total Protein, Urine: 7 mg/dL

## 2015-04-03 MED ORDER — SODIUM CHLORIDE 0.9 % IV SOLN
1000.0000 mL | INTRAVENOUS | Status: DC
  Administered 2015-04-03: 1000 mL via INTRAVENOUS

## 2015-04-03 MED ORDER — SODIUM CHLORIDE 0.9 % IV SOLN
1000.0000 mL | Freq: Once | INTRAVENOUS | Status: AC
  Administered 2015-04-03: 1000 mL via INTRAVENOUS

## 2015-04-03 MED ORDER — OXYCODONE-ACETAMINOPHEN 5-325 MG PO TABS: 1.0000 | ORAL_TABLET | Freq: Four times a day (QID) | ORAL | Status: DC | PRN

## 2015-04-03 MED ORDER — FENTANYL CITRATE (PF) 100 MCG/2ML IJ SOLN
50.0000 ug | Freq: Once | INTRAMUSCULAR | Status: AC
Start: 2015-04-03 — End: 2015-04-03
  Administered 2015-04-03: 50 ug via INTRAVENOUS
  Filled 2015-04-03: qty 2

## 2015-04-03 MED ORDER — PROCHLORPERAZINE EDISYLATE 5 MG/ML IJ SOLN
5.0000 mg | Freq: Once | INTRAMUSCULAR | Status: AC
  Administered 2015-04-03: 5 mg via INTRAVENOUS
  Filled 2015-04-03: qty 2

## 2015-04-03 MED ORDER — HYDROMORPHONE HCL 1 MG/ML IJ SOLN
1.0000 mg | Freq: Once | INTRAMUSCULAR | Status: AC
  Administered 2015-04-03: 1 mg via INTRAVENOUS
  Filled 2015-04-03: qty 1

## 2015-04-03 MED ORDER — HYDROMORPHONE HCL 1 MG/ML IJ SOLN
1.0000 mg | Freq: Once | INTRAMUSCULAR | Status: AC
  Administered 2015-04-03: 1 mg via INTRAVENOUS

## 2015-04-03 MED ORDER — CYCLOBENZAPRINE HCL 10 MG PO TABS
10.0000 mg | ORAL_TABLET | Freq: Once | ORAL | Status: AC
  Administered 2015-04-03: 10 mg via ORAL
  Filled 2015-04-03: qty 1

## 2015-04-03 MED ORDER — ONDANSETRON HCL 4 MG/2ML IJ SOLN
4.0000 mg | Freq: Once | INTRAMUSCULAR | Status: DC
  Filled 2015-04-03: qty 2

## 2015-04-03 MED ORDER — HYDROMORPHONE HCL 1 MG/ML IJ SOLN
0.5000 mg | Freq: Once | INTRAMUSCULAR | Status: AC
  Administered 2015-04-03: 0.5 mg via INTRAVENOUS
  Filled 2015-04-03: qty 1

## 2015-04-03 NOTE — ED Notes (Signed)
Pt to MR, Pt. Removed from monitor and IV paused.

## 2015-04-03 NOTE — ED Notes (Signed)
Pt ambulatory to restroom, tolerated well, eating ice chips,

## 2015-04-03 NOTE — ED Notes (Signed)
Pt reports had a baby 1 week ago and had tubes tied Monday.  Reports had spinal anesthesia and started c/o headache Friday.  Reports intermittent n/v.

## 2015-04-03 NOTE — ED Notes (Signed)
Pt ambulatory to restroom without assistance, tolerated well,

## 2015-04-03 NOTE — ED Notes (Addendum)
Pt pulse ox 100% RA, per Dr Lita Mains  Pt may be discharged,

## 2015-04-03 NOTE — Discharge Instructions (Signed)
Your MRI is negative for acute problem. Your test do not support Pre-Eclampsia . No evidence of urinary tract infection. Please see MD at the [facility] tomorrow for a "spine patch" procedure. Use percocet for headache pain. This may cause drowsiness, and may cause constipation, use with caution. Epidural Blood Patch for Spinal Headache An epidural blood patch is a procedure that is used to treat a headache that occurs when there is a leak of spinal fluid. This type of headache is called a spinal headache or post-dural puncture headache. Spinal headaches sometimes occur after a person undergoes a type of anesthesia called epidural anesthesia or after a lumbar puncture (also called a spinal tap).  Generally, an epidural blood patch is done when a spinal headache has not been relieved by 2-3 days of:   Bed rest.   Drinking lots of fluids.   Taking oral medicines for pain, including nonsteroidal anti-inflammatory agents and caffeine. It may also be done to treat a person who has had epidural anesthesia and is experiencing:   Neck pain and stiffness that are very severe and associated with vomiting.   Hearing loss.   Double vision.  An epidural blood patch is not done when:   Your headache is due to an infection in the lower back (lumbar) area or the blood.   You have bleeding tendencies.   You are taking certain blood-thinning medicines. LET [facility] CARE PROVIDER KNOW ABOUT:  Any allergies you have.   All medicines you are taking, including vitamins, herbs, eye drops, creams, and over-the-counter medicines.   Previous problems you or members of your family have had with the use of anesthetics.   Any blood disorders you have.   Previous surgeries you have had.   Medical conditions you have.  RISKS AND COMPLICATIONS Generally, an epidural blood patch is a safe procedure. However, as with any procedure, complications can occur. Possible complications  include:   Backache.  Nerve pain, tingling, or numbness.  Bleeding.  Infection. Complications are more likely to occur in people who have bleeding disorders or infections. BEFORE THE PROCEDURE  Drink a lot of water the day before your procedure.  Make sure your health care provider knows about all medicines and dietary or herbal supplements that you are taking. Take them as directed and find out if you need to stop any of them prior to the procedure.  Follow your health care provider's instructions for when to stop eating and drinking.  Arrange for someone to drive you to and from the procedure. PROCEDURE   You will have two IV lines placed--one to give you fluids and medicines during the procedure and one to withdraw blood for the patch.  You will lie on your stomach.  An X-ray machine will take pictures of your back to locate the area of leakage.  Dye may be injected so that the area can be seen well on an X-ray.  Blood will be drawn from your arm and injected into the leaking area. When the blood is injected, you may feel tightness in your buttocks, lower back, or thighs. AFTER THE PROCEDURE  You will be expected to lie on your back for 2-4 hours with some pillows under your knees. It is important to lie still while on your back so that a good clot can form. You should also avoid any straining, especially right after the procedure.  Most people obtain almost instant relief from the spinal headache. In some, the relief comes on  gradually over a 24-hour period. Some people experience mild backaches for a few days. In a few cases, people also have a mild, passing sensation of prickly or tingly skin (paresthesia), neck pain, or nerve-root pain.   This information is not intended to replace advice given to you by your health care provider. Make sure you discuss any questions you have with your health care provider.   Document Released: 10/18/2001 Document Revised: 02/16/2013  Document Reviewed: 12/08/2012 Elsevier Interactive Patient Education [employer].

## 2015-04-03 NOTE — ED Notes (Signed)
Hope NP at bedside,

## 2015-04-03 NOTE — ED Notes (Signed)
Pt states that she is feeling better, but is very drowsy, pulse ox on room air 89%, pt stimulated with little improvement in pulse ox, pt placed on oxygen at 2 lpm via [place] with increase in pulse ox to 94%, Dr Lita Mains notified,

## 2015-04-03 NOTE — ED Provider Notes (Signed)
CSN: [id number]     Arrival date & time 04/03/15  [id number] History   First MD Initiated Contact with Patient 04/03/15 [phone]     Chief Complaint  Patient presents with  . Headache     (Consider location/radiation/quality/duration/timing/severity/associated sxs/prior Treatment) HPI Comments: CC: Headache  Patient is a 24 year old female who presents to the emergency department with complaint of severe headache.  The patient states that she had a spontaneous vaginal delivery approximately 8 days ago. The following day she had a tubal ligation procedure. She had an epidural placed, and it remained in place until the tubal ligation procedure was completed. The patient states that on Friday, November 18 she began to have problems with a headache this headache got progressively worse she was seen in the emergency department on 11/21. She was given medication for pain, she began to feel some better and was discharged home. The patient states that upon arising this morning she had a very severe headache, mostly in the temporal area, but also in the upper portion of her neck. She's had nausea, and an episode of vomiting. She has photophobia present. She's not had any recent injury to the head or neck. She's not had any high fevers reported. She states that she had some mild headaches before the pregnancy and during the pregnancy, but nothing compared to the headache she had this morning.  The history is provided by the patient.    History reviewed. No pertinent past medical history. Past Surgical History  Procedure Laterality Date  . Appendectomy    . Ovarian cyst removal    . Tubal ligation     No family history on file. Social History  Substance Use Topics  . Smoking status: Former Research scientist (life sciences)  . Smokeless tobacco: None  . Alcohol Use: No   OB History    Gravida Para Term Preterm AB TAB SAB Ectopic Multiple Living   1              Review of Systems  Eyes: Positive for photophobia.   Gastrointestinal: Positive for nausea and vomiting.  Musculoskeletal: Positive for neck pain.  Neurological: Positive for light-headedness and headaches.  All other systems reviewed and are negative.     Allergies  Contrast media  Home Medications   Prior to Admission medications   Medication Sig Start Date End Date Taking? Authorizing Provider  ibuprofen (ADVIL,MOTRIN) 400 MG tablet Take 400 mg by mouth every 6 (six) hours as needed for mild pain or moderate pain.   Yes Historical Provider, MD  oxyCODONE-acetaminophen (PERCOCET/ROXICET) 5-325 MG tablet Take 1 tablet by mouth every 4 (four) hours as needed for moderate pain or severe pain.   Yes Historical Provider, MD  Prenatal Vit-Fe Fumarate-FA (PRENATAL MULTIVITAMIN) TABS tablet Take 1 tablet by mouth daily at 12 noon.   Yes Historical Provider, MD   BP 168/93 mmHg  Pulse 50  Temp(Src) 98.3 F (36.8 C) (Oral)  Resp 18  Ht 5\' 5"  (1.651 m)  Wt 104.327 kg  BMI 38.27 kg/m2  SpO2 95%  LMP 04/02/2015  Breastfeeding? Yes Physical Exam  Eyes: Conjunctivae and EOM are normal. Pupils are equal, round, and reactive to light. No scleral icterus.  Neck: Neck supple.  Cardiovascular: S1 normal and S2 normal.  Bradycardia present.  Exam reveals no friction rub.   No murmur heard. Musculoskeletal:  Tender to palpation of the cervical spine area.  Lymphadenopathy:    She has no cervical adenopathy.  Neurological: She displays no Babinski's sign  on the right side. She displays no Babinski's sign on the left side.  Reflex Scores:      Patellar reflexes are 2+ on the right side and 2+ on the left side.   ED Course  Pt seen with me by Dr Wyvonnia Dusky.  Procedures (including critical care time) Labs Review Labs Reviewed  CBC WITH DIFFERENTIAL/PLATELET  COMPREHENSIVE METABOLIC PANEL  URINALYSIS, ROUTINE W REFLEX MICROSCOPIC (NOT AT [facility])    Imaging Review No results found. I have personally reviewed and evaluated these images and  lab results as part of my medical decision-making.   EKG Interpretation   Date/Time:  Tuesday April 03 2015 08:41:19 EST Ventricular Rate:  54 PR Interval:  151 QRS Duration: 81 QT Interval:  452 QTC Calculation: 428 R Axis:   99 Text Interpretation:  Right and left arm electrode reversal,  interpretation assumes no reversal Sinus rhythm Borderline right axis  deviation Nonspecific T abnrm, anterolateral leads Baseline wander in  lead(s) II aVR aVF V1 V2 V3 V4 V5 V6 No previous ECGs available Confirmed  by [facility] ([id number]) on 04/03/2015 8:47:30 AM      MDM  Pt has a bradycardia of 50, b/p of 168/93. Cmet - non acute. Cbc hgb and hct slightly low, o/w wnl. Suspect Lumbar puncture headache, but will rule out UTI, post procedure thrombus, and post partum pre-eclampsia.  UA neg for UTI.  Pt return from MRI. Headache worse after lying down. IV dilaudid given.  MRI, and MR venogram negative.  Pt contiues to have headache. IV dilaudid given x 2. Spoke with Dr Evie Lacks, OB. No evidence for pre-eclampsia. Case discussed with Radiology. They will plan for patch procedure on tomorrow at [facility].   Final diagnoses:  Headache following lumbar puncture  Elevated blood pressure    *I have reviewed nursing notes, vital signs, and all appropriate lab and imaging results for this patient.[address], PA-C 04/07/15 1953  Ezequiel Essex, MD 04/09/15 1102

## 2015-04-03 NOTE — Discharge Instructions (Signed)
Call your doctor tomorrow and let them know that you were evaluated in the ED for your headache. If your headache worsen, you have persistent vomiting, dizziness or other problems, return immediately.

## 2016-04-28 ENCOUNTER — Emergency Department (HOSPITAL_COMMUNITY)
Admission: EM | Admit: 2016-04-28 | Discharge: 2016-04-28 | Disposition: A | Discharge status: Home / Self Care | Payer: Medicaid Other | Attending: Emergency Medicine | Admitting: Emergency Medicine

## 2016-04-28 ENCOUNTER — Encounter (HOSPITAL_COMMUNITY): Payer: Self-pay | Admitting: Emergency Medicine

## 2016-04-28 DIAGNOSIS — N390 Urinary tract infection, site not specified: Secondary | ICD-10-CM | POA: Insufficient documentation

## 2016-04-28 DIAGNOSIS — Z87891 Personal history of nicotine dependence: Secondary | ICD-10-CM | POA: Insufficient documentation

## 2016-04-28 HISTORY — DX: Calculus of kidney: N20.0

## 2016-04-28 LAB — URINALYSIS, ROUTINE W REFLEX MICROSCOPIC
Bilirubin Urine: NEGATIVE
Glucose, UA: NEGATIVE mg/dL
Ketones, ur: NEGATIVE mg/dL
Nitrite: POSITIVE — AB
PROTEIN: 100 mg/dL — AB
Specific Gravity, Urine: 1.015 (ref 1.005–1.030)
pH: 7 (ref 5.0–8.0)

## 2016-04-28 LAB — URINALYSIS, MICROSCOPIC (REFLEX)

## 2016-04-28 MED ORDER — IBUPROFEN 800 MG PO TABS
800.0000 mg | ORAL_TABLET | Freq: Once | ORAL | Status: AC
  Administered 2016-04-28: 800 mg via ORAL
  Filled 2016-04-28: qty 1

## 2016-04-28 MED ORDER — ONDANSETRON HCL 4 MG PO TABS
4.0000 mg | ORAL_TABLET | Freq: Once | ORAL | Status: AC
  Administered 2016-04-28: 4 mg via ORAL
  Filled 2016-04-28: qty 1

## 2016-04-28 MED ORDER — LIDOCAINE HCL (PF) 1 % IJ SOLN
INTRAMUSCULAR | Status: AC
  Administered 2016-04-28: 2.1 mL
  Filled 2016-04-28: qty 5

## 2016-04-28 MED ORDER — CEFTRIAXONE SODIUM 1 G IJ SOLR
1.0000 g | Freq: Once | INTRAMUSCULAR | Status: AC
  Administered 2016-04-28: 1 g via INTRAMUSCULAR
  Filled 2016-04-28: qty 10

## 2016-04-28 MED ORDER — CEPHALEXIN 500 MG PO CAPS: 500.0000 mg | ORAL_CAPSULE | Freq: Four times a day (QID) | ORAL | 0 refills | Status: DC

## 2016-04-28 MED ORDER — ACETAMINOPHEN 325 MG PO TABS
650.0000 mg | ORAL_TABLET | Freq: Once | ORAL | Status: AC
  Administered 2016-04-28: 650 mg via ORAL
  Filled 2016-04-28: qty 2

## 2016-04-28 MED ORDER — IBUPROFEN 600 MG PO TABS: 600.0000 mg | ORAL_TABLET | Freq: Four times a day (QID) | ORAL | 0 refills | Status: DC | PRN

## 2016-04-28 NOTE — ED Notes (Signed)
Pt has been taking AZO and TYlenol.

## 2016-04-28 NOTE — ED Triage Notes (Signed)
Onset Friday, flank pain with frequency, some burning when voiding over the weekend. States is started on right side now on left, flushed and nausea in triage

## 2016-04-28 NOTE — Discharge Instructions (Signed)
Please increase fluids. Use keflex and ibuprofen with each meal and at bed time (four times daily). See Dr Nadara Mustard or return t the ED if any changes or problem.

## 2016-04-28 NOTE — ED Provider Notes (Signed)
[facility] DEPT Provider Note   CSN: [id number] Arrival date & time: 04/28/16  1255 By signing my name below, I, Darlene Mcbride, attest that this documentation has been prepared under the direction and in the presence of Darlene Kocher, PA-C. Electronically Signed: Doran Mcbride, ED Scribe. 04/28/16. 4:33 PM.   History   Chief Complaint Chief Complaint  Patient presents with  . Flank Pain   The history is provided by the patient. No language interpreter was used.   HPI Comments: Darlene Mcbride is a 25 y.o. female who presents to the Emergency Department with a PMHx of kidney stones complaining of left flank pain that began 3 days ago. Pt states her pain started primarily in her right flank but today it is present in her left flank. Pt also reports dysuria, nausea, and vomiting. Pt denies any aggravating or alleviating factors. Pt had tried AZO tablets, Tylenol, and cranberry juice with mild relief. Pt denies any fevers, chills, CP, SOB, diarrhea or any other symptoms at this time. Pt denies any abdominal surgeries. NKDA  Past Medical History:  Diagnosis Date  . Kidney stones    There are no active problems to display for this patient.  Past Surgical History:  Procedure Laterality Date  . APPENDECTOMY    . OVARIAN CYST REMOVAL    . TUBAL LIGATION     OB History    Gravida Para Term Preterm AB Living   1             SAB TAB Ectopic Multiple Live Births                 Home Medications    Prior to Admission medications   Medication Sig Start Date End Date Taking? Authorizing Provider  ibuprofen (ADVIL,MOTRIN) 400 MG tablet Take 400 mg by mouth every 6 (six) hours as needed for mild pain or moderate pain.    Historical Provider, MD  oxyCODONE-acetaminophen (PERCOCET/ROXICET) 5-325 MG tablet Take 1 tablet by mouth every 6 (six) hours as needed. 04/03/15   Darlene Kocher, PA-C  Prenatal Vit-Fe Fumarate-FA (PRENATAL MULTIVITAMIN) TABS tablet Take 1 tablet by mouth daily at 12  noon.    Historical Provider, MD   Family History No family history on file.  Social History Social History  Substance Use Topics  . Smoking status: Former Research scientist (life sciences)  . Smokeless tobacco: Not on file  . Alcohol use No   Allergies   Contrast media [iodinated diagnostic agents]  Review of Systems Review of Systems  Constitutional: Negative for chills and fever.  Respiratory: Negative for shortness of breath.   Cardiovascular: Negative for chest pain.  Gastrointestinal: Positive for nausea and vomiting. Negative for diarrhea.  Genitourinary: Positive for dysuria and flank pain.  All other systems reviewed and are negative.  Physical Exam Updated Vital Signs BP 115/57   Pulse 87   Temp 98.4 F (36.9 C)   Resp 20   Ht 5\' 4"  (1.626 m)   Wt 235 lb (106.6 kg)   LMP 04/11/2016   SpO2 99%   BMI 40.34 kg/m   Physical Exam  Constitutional: She is oriented to person, place, and time. She appears well-developed and well-nourished.  HENT:  Head: Normocephalic.  Eyes: EOM are normal.  Neck: Normal range of motion.  Cardiovascular: Normal rate, regular rhythm and normal heart sounds.  Exam reveals no gallop and no friction rub.   No murmur heard. Pulmonary/Chest: Effort normal and breath sounds normal. No respiratory distress. She has no wheezes.  She has no rales. She exhibits no tenderness.  Abdominal: Soft. She exhibits no distension. There is tenderness (LLQ and suprapubic).  Bilateral CVA tenderness  Musculoskeletal: Normal range of motion.  Neurological: She is alert and oriented to person, place, and time.  Psychiatric: She has a normal mood and affect.  Nursing note and vitals reviewed.  ED Treatments / Results  DIAGNOSTIC STUDIES: Oxygen Saturation is 99% on room air, normal by my interpretation.    COORDINATION OF CARE: 4:38 PM Discussed treatment plan with pt at bedside including IM Rocephin injection and RX for at-home abx. Pt agreed to plan.  Labs (all labs  ordered are listed, but only abnormal results are displayed) Labs Reviewed  URINALYSIS, ROUTINE W REFLEX MICROSCOPIC - Abnormal; Notable for the following:       Result Value   APPearance CLOUDY (*)    Hgb urine dipstick LARGE (*)    Protein, ur 100 (*)    Nitrite POSITIVE (*)    Leukocytes, UA SMALL (*)    All other components within normal limits  URINALYSIS, MICROSCOPIC (REFLEX) - Abnormal; Notable for the following:    Bacteria, UA FEW (*)    Squamous Epithelial / LPF 0-5 (*)    All other components within normal limits  URINE CULTURE    EKG  EKG Interpretation None       Radiology No results found.  Procedures Procedures (including critical care time)  Medications Ordered in ED Medications - No data to display   Initial Impression / Assessment and Plan / ED Course  I have reviewed the triage vital signs and the nursing notes.  Pertinent labs & imaging results that were available during my care of the patient were reviewed by me and considered in my medical decision making (see chart for details).  Clinical Course      Final Clinical Impressions(s) / ED Diagnoses  MDM Vital signs within normal limits. Patient is a 2 or 3 days of flank pain, increased urine frequency, and pain with urination. Urinalysis reveals a urinary tract infection. The examination does not favor renal stone on. Patient treated with Keflex and ibuprofen. I've asked the patient to increase fluids. She will continue her AZO for now. She is to have her urine rechecked in 7-10 days after finishing the antibiotic. Culture has been sent to the lab. Patient is in agreement with this discharge plan.    Final diagnoses:  None    New Prescriptions New Prescriptions   No medications on file  **I personally performed the services described in this documentation, which was scribed in my presence. The recorded information has been reviewed and is accurate.Darlene Kocher, PA-C 04/30/16 2207     Darlene Sorrow, MD 05/01/16 [phone]

## 2016-05-01 LAB — URINE CULTURE: SPECIAL REQUESTS: NORMAL

## 2016-05-02 ENCOUNTER — Telehealth: Payer: Self-pay

## 2016-05-02 NOTE — Telephone Encounter (Signed)
Post ED Visit - Positive Culture Follow-up  Culture report reviewed by antimicrobial stewardship pharmacist:  []  Elenor Quinones, Pharm.D. []  Heide Guile, Pharm.D., BCPS [x]  Parks Neptune, Pharm.D. []  Alycia Rossetti, Pharm.D., BCPS []  [place], Pharm.D., BCPS, AAHIVP []  Legrand Como, Pharm.D., BCPS, AAHIVP []  Milus Glazier, Pharm.D. []  Rob Evette Doffing, Pharm.D.  Positive urine culture Treated with Cephalexin, organism sensitive to the same and no further patient follow-up is required at this time.  Genia Del 05/02/2016, 9:08 AM

## 2016-10-21 IMAGING — MR MR [PERSON_NAME] HEAD
1 series · 48 of 48 positions shown · non-contrast
Comparison: MRI brain from the same day.

CLINICAL DATA: New onset headaches. Epidural anesthesia for a
spontaneous vaginal delivery and tubal ligation 1 week ago.

EXAM:
MR VENOGRAM the HEAD WITHOUT CONTRAST
TECHNIQUE: Angiographic images of the intracranial venous structures were
obtained using MRV technique without intravenous contrast.

[Series 3: tof_2d_veins · sagittal · 3.0mm · 0.98mm/px · 48 of 75 slices shown]
[im 1/75]
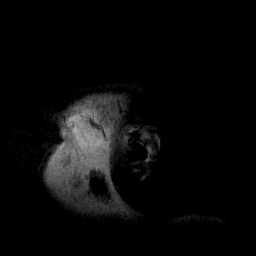
[im 2/75]
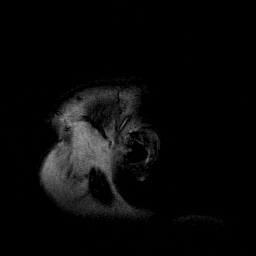
[im 4/75]
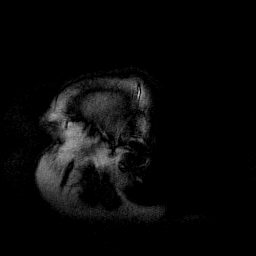
[im 5/75]
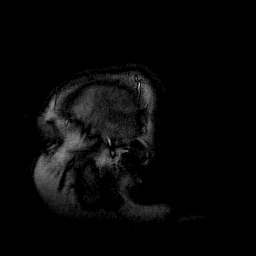
[im 7/75]
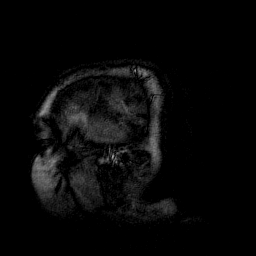
[im 8/75]
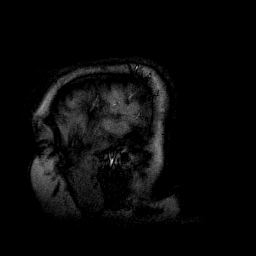
[im 10/75]
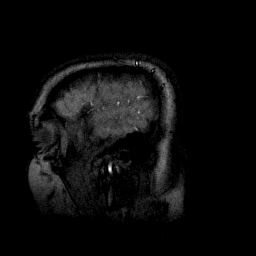
[im 12/75]
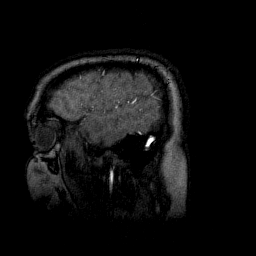
[im 13/75]
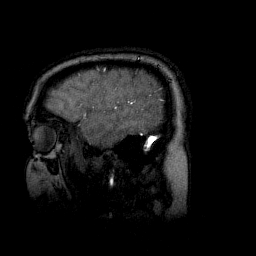
[im 15/75]
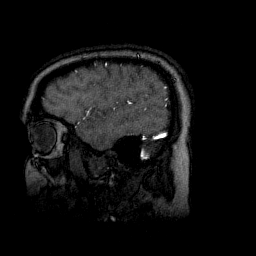
[im 16/75]
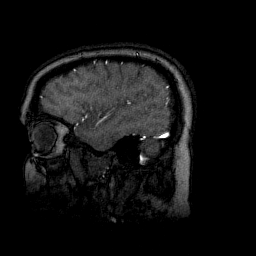
[im 18/75]
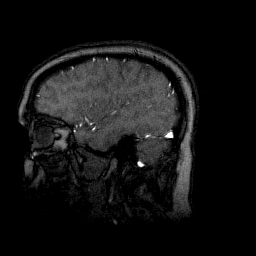
[im 19/75]
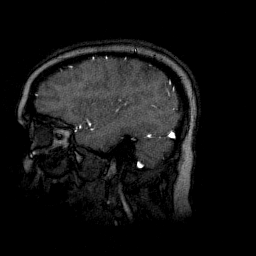
[im 21/75]
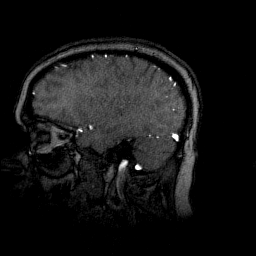
[im 23/75]
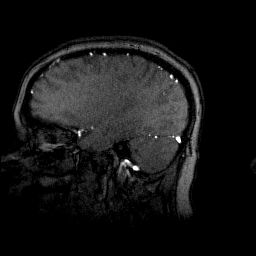
[im 24/75]
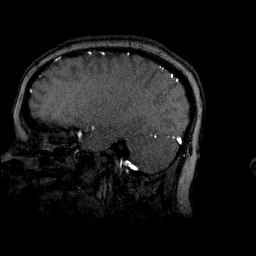
[im 26/75]
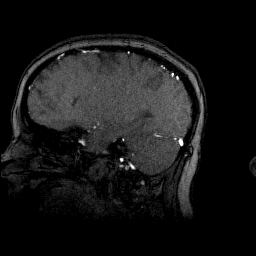
[im 27/75]
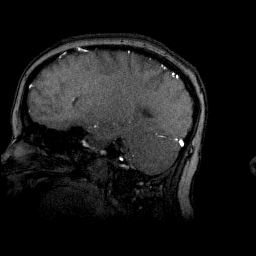
[im 29/75]
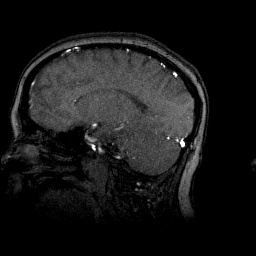
[im 30/75]
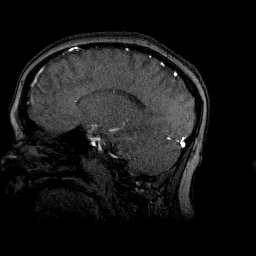
[im 32/75]
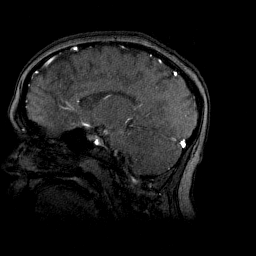
[im 34/75]
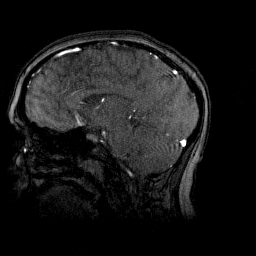
[im 35/75]
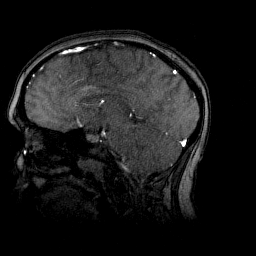
[im 37/75]
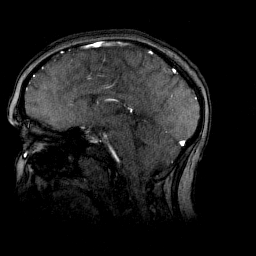
[im 38/75]
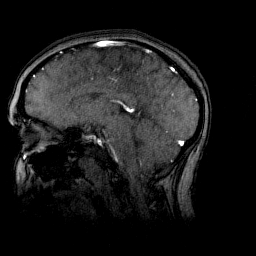
[im 40/75]
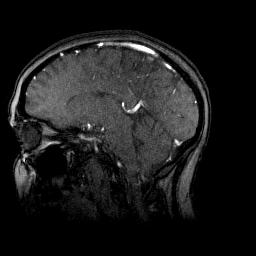
[im 41/75]
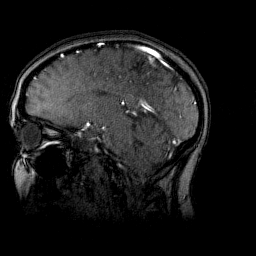
[im 43/75]
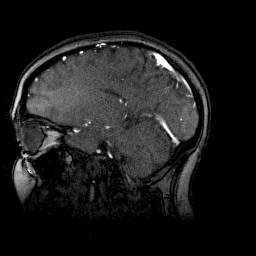
[im 45/75]
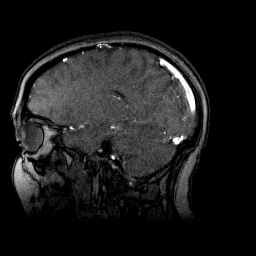
[im 46/75]
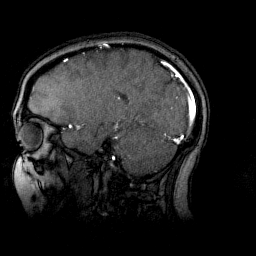
[im 48/75]
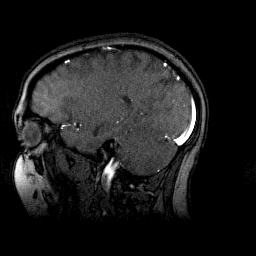
[im 49/75]
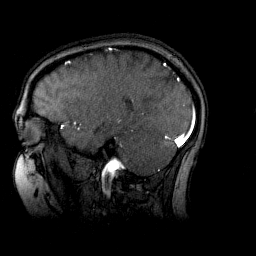
[im 51/75]
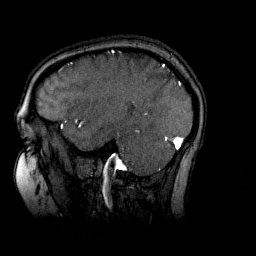
[im 52/75]
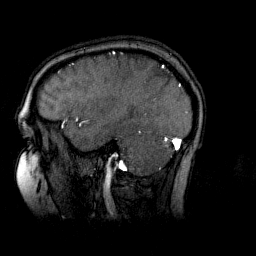
[im 54/75]
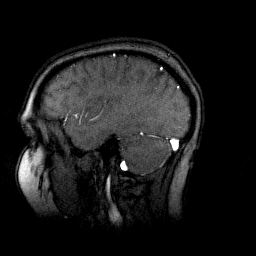
[im 56/75]
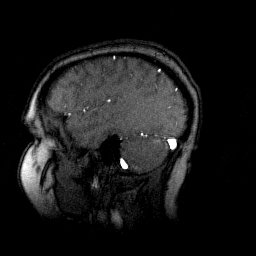
[im 57/75]
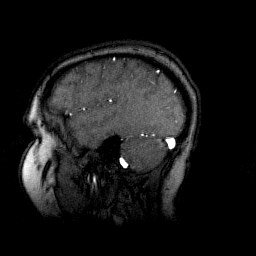
[im 59/75]
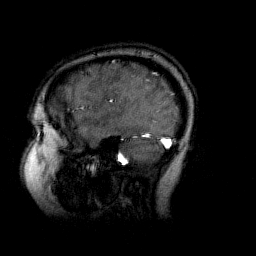
[im 60/75]
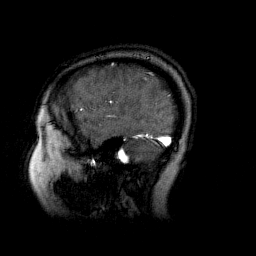
[im 62/75]
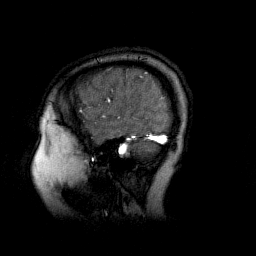
[im 63/75]
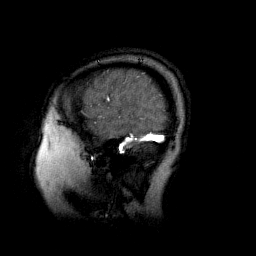
[im 65/75]
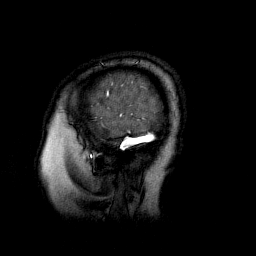
[im 67/75]
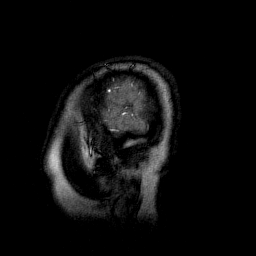
[im 68/75]
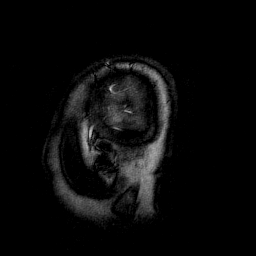
[im 70/75]
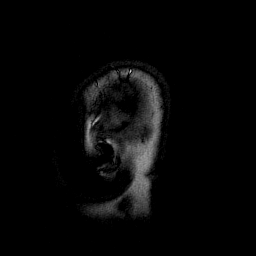
[im 71/75]
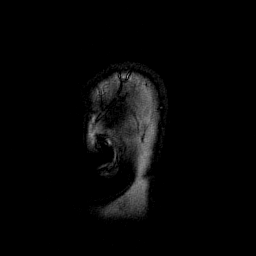
[im 73/75]
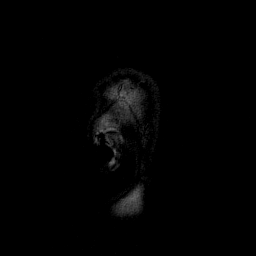
[im 75/75]
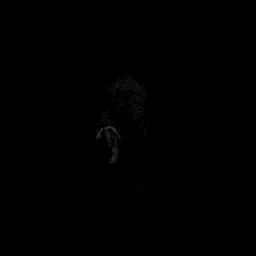

[48 of 48 positions shown; findings below may reference images not displayed]

FINDINGS: The dural sinuses are patent. The right transverse sinus is
dominant. The straight sinus deep cerebral veins are intact. The
cortical veins are within normal limits.
IMPRESSION: Negative MR venogram.

## 2017-08-21 ENCOUNTER — Other Ambulatory Visit: Payer: Self-pay

## 2017-08-21 ENCOUNTER — Ambulatory Visit (INDEPENDENT_AMBULATORY_CARE_PROVIDER_SITE_OTHER): Payer: BLUE CROSS/BLUE SHIELD | Admitting: Family Medicine

## 2017-08-21 ENCOUNTER — Encounter: Payer: Self-pay | Admitting: Family Medicine

## 2017-08-21 VITALS — BP 120/78 | HR 93 | Resp 16 | Ht 65.0 in | Wt 239.0 lb

## 2017-08-21 DIAGNOSIS — Z9889 Other specified postprocedural states: Secondary | ICD-10-CM

## 2017-08-21 DIAGNOSIS — F339 Major depressive disorder, recurrent, unspecified: Secondary | ICD-10-CM | POA: Insufficient documentation

## 2017-08-21 DIAGNOSIS — N643 Galactorrhea not associated with childbirth: Secondary | ICD-10-CM | POA: Diagnosis not present

## 2017-08-21 DIAGNOSIS — Z23 Encounter for immunization: Secondary | ICD-10-CM | POA: Diagnosis not present

## 2017-08-21 DIAGNOSIS — R5383 Other fatigue: Secondary | ICD-10-CM | POA: Diagnosis not present

## 2017-08-21 MED ORDER — ESCITALOPRAM OXALATE 10 MG PO TABS: 10.0000 mg | ORAL_TABLET | Freq: Every day | ORAL | 0 refills | Status: DC
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
## 2017-08-22 LAB — PROLACTIN: Prolactin: 6.3 ng/mL

## 2017-08-22 LAB — COMPLETE METABOLIC PANEL WITH GFR
AG Ratio: 1.7 (calc) (ref 1.0–2.5)
ALKALINE PHOSPHATASE (APISO): 63 U/L (ref 33–115)
ALT: 22 U/L (ref 6–29)
AST: 18 U/L (ref 10–30)
Albumin: 4.3 g/dL (ref 3.6–5.1)
BILIRUBIN TOTAL: 0.4 mg/dL (ref 0.2–1.2)
BUN: 7 mg/dL (ref 7–25)
CHLORIDE: 105 mmol/L (ref 98–110)
CO2: 27 mmol/L (ref 20–32)
CREATININE: 0.57 mg/dL (ref 0.50–1.10)
Calcium: 9.1 mg/dL (ref 8.6–10.2)
GFR, Est African American: 146 mL/min/{1.73_m2} (ref >=60)
GFR, Est Non African American: 126 mL/min/{1.73_m2} (ref >=60)
GLOBULIN: 2.6 g/dL (ref 1.9–3.7)
Glucose, Bld: 83 mg/dL (ref 65–99)
Potassium: 4.1 mmol/L (ref 3.5–5.3)
SODIUM: 138 mmol/L (ref 135–146)
Total Protein: 6.9 g/dL (ref 6.1–8.1)

## 2017-08-22 LAB — CBC WITH DIFFERENTIAL/PLATELET
Basophils Absolute: 24 cells/uL (ref 0–200)
Basophils Relative: 0.2 %
Eosinophils Absolute: 312 cells/uL (ref 15–500)
Eosinophils Relative: 2.6 %
HCT: 39 % (ref 35.0–45.0)
Hemoglobin: 13.5 g/dL (ref 11.7–15.5)
Lymphs Abs: 3300 cells/uL (ref 850–3900)
MCH: 31 pg (ref 27.0–33.0)
MCHC: 34.6 g/dL (ref 32.0–36.0)
MCV: 89.7 fL (ref 80.0–100.0)
MPV: 10.9 fL (ref 7.5–12.5)
Monocytes Relative: 4.6 %
Neutro Abs: 7812 cells/uL — ABNORMAL HIGH (ref 1500–7800)
Neutrophils Relative %: 65.1 %
Platelets: 347 10*3/uL (ref 140–400)
RBC: 4.35 10*6/uL (ref 3.80–5.10)
RDW: 13.3 % (ref 11.0–15.0)
Total Lymphocyte: 27.5 %
WBC mixed population: 552 cells/uL (ref 200–950)
WBC: 12 10*3/uL — ABNORMAL HIGH (ref 3.8–10.8)

## 2017-08-22 LAB — TSH: TSH: 0.74 mIU/L

## 2017-08-22 LAB — VITAMIN D 25 HYDROXY (VIT D DEFICIENCY, FRACTURES): Vit D, 25-Hydroxy: 26 ng/mL — ABNORMAL LOW (ref 30–100)

## 2017-08-22 LAB — VITAMIN B12: Vitamin B-12: 520 pg/mL (ref 200–1100)

## 2017-08-22 LAB — FSH/LH
FSH: 2.1 m[IU]/mL
LH: 2.6 m[IU]/mL

## 2017-08-22 LAB — ESTRADIOL: Estradiol: 159 pg/mL

## 2017-08-25 ENCOUNTER — Encounter: Payer: Self-pay | Admitting: Family Medicine

## 2017-09-01 ENCOUNTER — Other Ambulatory Visit: Payer: Self-pay

## 2017-09-01 ENCOUNTER — Encounter: Payer: Self-pay | Admitting: Family Medicine

## 2017-09-01 ENCOUNTER — Ambulatory Visit (INDEPENDENT_AMBULATORY_CARE_PROVIDER_SITE_OTHER): Payer: BLUE CROSS/BLUE SHIELD | Admitting: Family Medicine

## 2017-09-01 VITALS — BP 120/76 | HR 76 | Temp 98.4°F | Resp 20 | Ht 65.0 in | Wt 240.0 lb

## 2017-09-01 DIAGNOSIS — E669 Obesity, unspecified: Secondary | ICD-10-CM | POA: Diagnosis not present

## 2017-09-01 DIAGNOSIS — F339 Major depressive disorder, recurrent, unspecified: Secondary | ICD-10-CM | POA: Diagnosis not present

## 2017-09-01 DIAGNOSIS — L709 Acne, unspecified: Secondary | ICD-10-CM | POA: Diagnosis not present

## 2017-09-01 DIAGNOSIS — L739 Follicular disorder, unspecified: Secondary | ICD-10-CM | POA: Diagnosis not present

## 2017-09-01 MED ORDER — DOXYCYCLINE HYCLATE 100 MG PO TABS: 100.0000 mg | ORAL_TABLET | Freq: Two times a day (BID) | ORAL | 1 refills | Status: DC

## 2017-09-01 MED ORDER — DOXYCYCLINE HYCLATE 100 MG PO TABS: 100.0000 mg | ORAL_TABLET | Freq: Two times a day (BID) | ORAL | 0 refills | Status: DC

## 2017-09-01 NOTE — Patient Instructions (Signed)
Take the doxycycline twice a day for two weeks and then once a day for the next month.

## 2017-09-01 NOTE — Progress Notes (Signed)
Patient ID: Darlene Mcbride, female    DOB: [birth year], 28 y.o.   MRN: [id number]  Chief Complaint  Patient presents with  . Follow-up    Allergies Contrast media [iodinated diagnostic agents]  Subjective:   Darlene Mcbride is a 27 y.o. female who presents to [facility] today.  HPI Derrick presents for follow-up today.  She reports that she has been taking the Lexapro each day as directed.  She reports that her mood is much improved.  She is not feeling depression or feeling down.  Her energy level is better.  She does report that she has been eating more.  She used to only eat one meal a day.  She reports that she has been eating more frequent meals.  She reports she is afraid she might gain weight, however she has not.  She does report she is more active.  She is committed to trying to lose weight and would be interested in seeing a dietitian.  She denies any suicidal or homicidal ideations.  She denies any hallucinations.  Reports that she has not had any side effects with the medication.  Reports that she did not get a call from the behavioral health regarding an appointment.  She is interested in seeing a therapist and psychiatrist.  She does report that she has been getting the acne type bumps in her groin area near her thighs and under her breast with the past 2 to 3 months.  She has had them under her arms in the past.  She reports that they do get tender and hurt.  She reports that they get red.  She has never had a diagnosis of hidradenitis in the past.  She has never really had acne.  She reports that she does sweat a lot in 10 to get these bumps when she sweats more.  She does pick at them occasionally.  Has not used any medication or creams to make them better.  Reports they seem like painful acne bumps.  She canceled the appointment with the gynecologist but has rescheduled it for May.  She reports that she did start her periods.   Past Medical History:  Diagnosis  Date  . Kidney stones     Past Surgical History:  Procedure Laterality Date  . APPENDECTOMY    . LEEP    . OVARIAN CYST REMOVAL    . TUBAL LIGATION      Family History  Problem Relation Age of Onset  . Diabetes Mother   . Hypertension Father   . Depression Father   . Post-traumatic stress disorder Father   . Breast cancer Maternal Grandmother   . Breast cancer Paternal Grandmother   . Brain cancer Maternal Grandfather   . Kidney cancer Maternal Grandfather      Social History   Socioeconomic History  . Marital status: Married    Spouse name: Not on file  . Number of children: Not on file  . Years of education: Not on file  . Highest education level: Not on file  Occupational History  . Not on file  Social Needs  . Financial resource strain: Not on file  . Food insecurity:    Worry: Not on file    Inability: Not on file  . Transportation needs:    Medical: Not on file    Non-medical: Not on file  Tobacco Use  . Smoking status: Current Every Day Smoker    Types: Cigarettes  . Smokeless  tobacco: Never Used  . Tobacco comment: wants to discuss options   Substance and Sexual Activity  . Alcohol use: No  . Drug use: No  . Sexual activity: Not on file  Lifestyle  . Physical activity:    Days per week: Not on file    Minutes per session: Not on file  . Stress: Not on file  Relationships  . Social connections:    Talks on phone: Not on file    Gets together: Not on file    Attends religious service: Not on file    Active member of club or organization: Not on file    Attends meetings of clubs or organizations: Not on file    Relationship status: Not on file  Other Topics Concern  . Not on file  Social History Narrative   43, 37,9 year olds.    Married for 5 years.   Stay at home mom.   Eats all food groups.   Wear seatbelt.      Eye exam in 06/2017.   Enjoys time with family, read.     Review of Systems  Constitutional: Negative for activity change,  appetite change and fever.  HENT: Negative for trouble swallowing.   Eyes: Negative for visual disturbance.  Respiratory: Negative for cough, chest tightness and shortness of breath.   Cardiovascular: Negative for chest pain, palpitations and leg swelling.  Gastrointestinal: Negative for abdominal pain, constipation, nausea and vomiting.  Genitourinary: Negative for dysuria, frequency and urgency.  Musculoskeletal: Negative for back pain and myalgias.  Skin: Positive for rash. Negative for color change, pallor and wound.  Neurological: Negative for dizziness, tremors, syncope, light-headedness and headaches.  Hematological: Negative for adenopathy.  Psychiatric/Behavioral: Negative for dysphoric mood, self-injury, sleep disturbance and suicidal ideas. The patient is not nervous/anxious and is not hyperactive.      Objective:   BP 120/76 (BP Location: Left Arm, Patient Position: Sitting, Cuff Size: Normal)   Pulse 76   Temp 98.4 F (36.9 C) (Temporal)   Resp 20   Ht 5\' 5"  (1.651 m)   Wt 240 lb (108.9 kg)   SpO2 96%   BMI 39.94 kg/m   Physical Exam  Constitutional: She is oriented to person, place, and time. She appears well-developed and well-nourished. No distress.  HENT:  Head: Normocephalic and atraumatic.  Eyes: Pupils are equal, round, and reactive to light.  Neck: Normal range of motion. Neck supple. No thyromegaly present.  Cardiovascular: Normal rate, regular rhythm and normal heart sounds.  Pulmonary/Chest: Effort normal and breath sounds normal. No respiratory distress.  Neurological: She is alert and oriented to person, place, and time. No cranial nerve deficit.  Skin: Skin is warm and dry.  3 to 4 mm erythematous papule/pustule lesions scattered under breasts bilaterally.  Surrounding erythema.  Same areas located in right groin area.  Approximate #20 total.  Psychiatric: She has a normal mood and affect. Her behavior is normal. Judgment and thought content normal.  Her mood appears not anxious. Her affect is not labile. She is not agitated and not aggressive. She does not express impulsivity. She does not exhibit a depressed mood. She expresses no homicidal and no suicidal ideation. She expresses no suicidal plans and no homicidal plans.  Nursing note and vitals reviewed.    Assessment and Plan  1. Depression, recurrent ([place]) Improved on Celexa.  Continue medications daily.  Suicide precautions given and reiterated again today.  Will place virtual behavioral health consult again today.  Due  to patient's history, I do recommend that she see psychiatry and therapist.  2. Acne, unspecified acne type/folliculitis - doxycycline (VIBRA-TABS) 100 MG tablet; Take 1 tablet (100 mg total) by mouth 2 (two) times daily.  Patient was instructed to take medication 1 pill twice a day for 2 weeks and then once a day for the next month.  Call with any questions or concerns.  Skin hygiene and skin care precautions discussed.  Patient encouraged not to pick at the lesions.  Dispense: 60 tablet; Refill: 1 Patient counseled in detail regarding the risks of medication. Told to call or return to clinic if develop any worrisome signs or symptoms. Patient voiced understanding.   4. Obesity (BMI 30-39.9) Lifestyle modifications discussed with patient including a diet emphasizing vegetables, fruits, and whole grains. Limiting intake of sodium to less than 2,400 mg per day.  Recommendations discussed include consuming low-fat dairy products, poultry, fish, legumes, non-tropical vegetable oils, and nuts; and limiting intake of sweets, sugar-sweetened beverages, and red meat. Discussed following a plan such as the Dietary Approaches to Stop Hypertension (DASH) diet. Patient to read up on this diet.  The patient is asked to make an attempt to improve diet and exercise patterns to aid in medical management of this problem. Weight loss recommended and discussed. - Amb ref to Medical  Nutrition Therapy-MNT  Patient was encouraged to keep her scheduled follow-up visit with gynecology for her menstrual irregularities and occasional nipple discharge. Return in about 2 months (around 11/01/2017) for follow up. Caren Macadam, MD 09/01/2017

## 2017-09-02 ENCOUNTER — Telehealth (HOSPITAL_COMMUNITY): Payer: Self-pay

## 2017-09-02 NOTE — Telephone Encounter (Signed)
[facility] - left message.  

## 2017-09-03 ENCOUNTER — Encounter: Payer: Self-pay | Admitting: Family Medicine

## 2017-09-03 ENCOUNTER — Other Ambulatory Visit: Payer: Self-pay | Admitting: Adult Health

## 2017-09-08 ENCOUNTER — Encounter: Payer: Self-pay | Admitting: Family Medicine

## 2017-09-08 DIAGNOSIS — L739 Follicular disorder, unspecified: Secondary | ICD-10-CM

## 2017-09-08 DIAGNOSIS — L709 Acne, unspecified: Secondary | ICD-10-CM

## 2017-09-09 MED ORDER — DOXYCYCLINE HYCLATE 100 MG PO TABS: 100.0000 mg | ORAL_TABLET | Freq: Two times a day (BID) | ORAL | 1 refills | Status: DC

## 2017-09-09 NOTE — Telephone Encounter (Signed)
Please call patient and see what she needs, I do not exactly understand this message.  Gwen Her. Mannie Stabile, MD

## 2017-09-15 ENCOUNTER — Encounter: Payer: Self-pay | Admitting: Family Medicine

## 2017-09-15 DIAGNOSIS — Z720 Tobacco use: Secondary | ICD-10-CM

## 2017-09-16 MED ORDER — NICOTINE 21 MG/24HR TD PT24: 21.0000 mg | MEDICATED_PATCH | Freq: Every day | TRANSDERMAL | 0 refills | Status: DC

## 2017-09-28 ENCOUNTER — Encounter: Payer: Self-pay | Admitting: Adult Health

## 2017-09-28 ENCOUNTER — Other Ambulatory Visit: Payer: Self-pay | Admitting: Family Medicine

## 2017-09-28 ENCOUNTER — Other Ambulatory Visit: Payer: Self-pay

## 2017-09-28 ENCOUNTER — Other Ambulatory Visit (HOSPITAL_COMMUNITY)
Admission: RE | Admit: 2017-09-28 | Discharge: 2017-09-28 | Disposition: A | Discharge status: Home / Self Care | Payer: BLUE CROSS/BLUE SHIELD | Source: Ambulatory Visit | Attending: Adult Health | Admitting: Adult Health

## 2017-09-28 ENCOUNTER — Ambulatory Visit (INDEPENDENT_AMBULATORY_CARE_PROVIDER_SITE_OTHER): Payer: BLUE CROSS/BLUE SHIELD | Admitting: Adult Health

## 2017-09-28 VITALS — BP 110/70 | HR 98 | Ht 64.0 in | Wt 243.0 lb

## 2017-09-28 DIAGNOSIS — Z8742 Personal history of other diseases of the female genital tract: Secondary | ICD-10-CM | POA: Diagnosis not present

## 2017-09-28 DIAGNOSIS — F339 Major depressive disorder, recurrent, unspecified: Secondary | ICD-10-CM

## 2017-09-28 DIAGNOSIS — Z01419 Encounter for gynecological examination (general) (routine) without abnormal findings: Secondary | ICD-10-CM | POA: Insufficient documentation

## 2017-09-28 DIAGNOSIS — N946 Dysmenorrhea, unspecified: Secondary | ICD-10-CM

## 2017-09-28 DIAGNOSIS — N921 Excessive and frequent menstruation with irregular cycle: Secondary | ICD-10-CM

## 2017-09-28 DIAGNOSIS — Z01411 Encounter for gynecological examination (general) (routine) with abnormal findings: Secondary | ICD-10-CM | POA: Diagnosis not present

## 2017-09-28 DIAGNOSIS — N6452 Nipple discharge: Secondary | ICD-10-CM

## 2017-09-28 MED ORDER — ESCITALOPRAM OXALATE 10 MG PO TABS: 10.0000 mg | ORAL_TABLET | Freq: Every day | ORAL | 2 refills | Status: DC

## 2017-09-28 NOTE — Addendum Note (Signed)
Addended by: Diona Fanti A on: 09/28/2017 04:37 PM   Modules accepted: Orders

## 2017-09-28 NOTE — Progress Notes (Signed)
Patient ID: Darlene Mcbride, female   DOB: 09-19-1990, 27 y.o.   MRN: [id number] History of Present Illness: Darlene Mcbride is a 27 year old white female, married, in for a well woman gyn exam and pap.She is new to this practice, and referred by her PCP, Dr Mannie Stabile.   Current Medications, Allergies, Past Medical History, Past Surgical History, Family History and Social History were reviewed in [employer] record.     Review of Systems: Patient denies any headaches, hearing loss, fatigue, blurred vision, shortness of breath, chest pain, abdominal pain, problems with bowel movements, urination, or intercourse. No joint pain or mood swings. +nipple discharge on right for about a year. Periods are heavier since tubal, lasting 6 days at times and cramps are terrible.     Physical Exam:BP 110/70 (BP Location: Right Arm, Patient Position: Sitting, Cuff Size: Large)   Pulse 98   Ht 5\' 4"  (1.626 m)   Wt 243 lb (110.2 kg)   LMP 09/21/2017   BMI 41.71 kg/m  General:  Well developed, well nourished, no acute distress Skin:  Warm and dry Neck:  Midline trachea, normal thyroid, good ROM, no lymphadenopathy Lungs; Clear to auscultation bilaterally Breast:  No dominant palpable mass, retraction, or nipple discharge on left, on right there is a white creamy nipple charge when squeezed( she had normal labs in April), slide sent with expressed discharge Cardiovascular: Regular rate and rhythm Abdomen:  Soft, non tender, no hepatosplenomegaly Pelvic:  External genitalia is normal in appearance, no lesions.  The vagina is normal in appearance. Urethra has no lesions or masses. The cervix is bulbous, irregular at os and friable with EC brush, when pap with HPV and GC/CHL performed. Marland Kitchen  Uterus is felt to be normal size, shape, and contour.  No adnexal masses or tenderness noted.Bladder is non tender, no masses felt. Extremities/musculoskeletal:  No swelling or varicosities noted, no clubbing or  cyanosis Psych:  No mood changes, alert and cooperative,seems happy PHQ 9 score 6, is on meds and denies being suicidal.  Discussed ablation vs low dose OCs.  Impression:  1. Encounter for gynecological examination with Papanicolaou smear of cervix   2. History of abnormal cervical Pap smear   3. Nipple discharge   4. Dysmenorrhea   5. Menorrhagia with irregular cycle      Plan: Nipple discharge to path Leave breasts alone Review handout on endometrial ablation Physical in 1 year Pap in 3 if normal

## 2017-09-29 ENCOUNTER — Encounter: Payer: Self-pay | Admitting: Adult Health

## 2017-09-30 LAB — CYTOLOGY - PAP
CHLAMYDIA, DNA PROBE: NEGATIVE
DIAGNOSIS: NEGATIVE
NEISSERIA GONORRHEA: NEGATIVE

## 2017-10-01 ENCOUNTER — Telehealth: Payer: Self-pay | Admitting: Adult Health

## 2017-10-01 DIAGNOSIS — N6452 Nipple discharge: Secondary | ICD-10-CM

## 2017-10-01 NOTE — Telephone Encounter (Signed)
Pt aware path showed atypia, will get [place] and ?mammogram at [facility] scheduled for June 11 at 3:50 pm

## 2017-10-08 ENCOUNTER — Encounter: Payer: Self-pay | Admitting: Adult Health

## 2017-10-09 ENCOUNTER — Other Ambulatory Visit: Payer: Self-pay | Admitting: Women's Health

## 2017-10-09 MED ORDER — LO LOESTRIN FE 1 MG-10 MCG / 10 MCG PO TABS: 1.0000 | ORAL_TABLET | Freq: Every day | ORAL | 3 refills | Status: DC

## 2017-10-09 NOTE — Progress Notes (Signed)
Pt sent mychart message, wants to not be on period when she goes on cruise/honeymoon, wants to try coc's. No h/o HTN, DVT/PE, CVA, MI, or migraines w/ aura. Rx LoLoestrin, take continuously until after cruise. If remains on them, come for f/u in 69mths.  Roma Schanz, CNM, WHNP-BC 10/09/2017 11:19 AM

## 2017-10-12 ENCOUNTER — Other Ambulatory Visit: Payer: Self-pay | Admitting: Women's Health

## 2017-10-12 MED ORDER — NORGESTIMATE-ETH ESTRADIOL 0.25-35 MG-MCG PO TABS: 1.0000 | ORAL_TABLET | Freq: Every day | ORAL | 11 refills | Status: DC

## 2017-10-12 NOTE — Progress Notes (Signed)
Insurance wouldn't cover LoLo, rx sprintec.  Roma Schanz, CNM, WHNP-BC 10/12/2017 1:01 PM

## 2017-10-16 ENCOUNTER — Encounter: Payer: Self-pay | Admitting: Family Medicine

## 2017-10-20 ENCOUNTER — Ambulatory Visit (HOSPITAL_COMMUNITY)
Admission: RE | Admit: 2017-10-20 | Discharge: 2017-10-20 | Disposition: A | Discharge status: Home / Self Care | Payer: BLUE CROSS/BLUE SHIELD | Source: Ambulatory Visit | Attending: Adult Health | Admitting: Adult Health

## 2017-10-20 DIAGNOSIS — N6452 Nipple discharge: Secondary | ICD-10-CM | POA: Insufficient documentation

## 2017-11-16 ENCOUNTER — Ambulatory Visit: Payer: BLUE CROSS/BLUE SHIELD | Admitting: Nutrition

## 2017-11-17 ENCOUNTER — Ambulatory Visit: Payer: BLUE CROSS/BLUE SHIELD | Admitting: Family Medicine

## 2017-11-19 ENCOUNTER — Telehealth (HOSPITAL_COMMUNITY): Payer: Self-pay

## 2017-11-19 NOTE — Telephone Encounter (Signed)
[facility] - Left Msg 

## 2017-12-17 ENCOUNTER — Telehealth: Payer: Self-pay

## 2017-12-17 NOTE — Telephone Encounter (Signed)
[facility] - Left Msg 

## 2017-12-22 ENCOUNTER — Ambulatory Visit: Payer: BLUE CROSS/BLUE SHIELD | Admitting: Adult Health

## 2017-12-22 ENCOUNTER — Ambulatory Visit (INDEPENDENT_AMBULATORY_CARE_PROVIDER_SITE_OTHER): Payer: BLUE CROSS/BLUE SHIELD | Admitting: Adult Health

## 2017-12-22 ENCOUNTER — Other Ambulatory Visit (HOSPITAL_COMMUNITY)
Admission: RE | Admit: 2017-12-22 | Discharge: 2017-12-22 | Disposition: A | Discharge status: Home / Self Care | Payer: BLUE CROSS/BLUE SHIELD | Source: Ambulatory Visit | Attending: Adult Health | Admitting: Adult Health

## 2017-12-22 ENCOUNTER — Encounter: Payer: Self-pay | Admitting: Adult Health

## 2017-12-22 VITALS — BP 115/75 | HR 82 | Ht 64.0 in | Wt 253.0 lb

## 2017-12-22 DIAGNOSIS — N6452 Nipple discharge: Secondary | ICD-10-CM | POA: Insufficient documentation

## 2017-12-22 DIAGNOSIS — N6342 Unspecified lump in left breast, subareolar: Secondary | ICD-10-CM | POA: Diagnosis not present

## 2017-12-22 NOTE — Progress Notes (Signed)
  Subjective:     Patient ID: Darlene Mcbride, female   DOB: Jan 05, 1991, 27 y.o.   MRN: [id number]  HPI Darlene Mcbride is a 27 year old white female, married in complaining of mass under left nipple and tenderness for about 2 weeks and now right breast discharge is bloody.  Review of Systems Mass and tenderness left breast under nipple for about 2 weeks Bloody nipple discharge on right Reviewed past medical,surgical, social and family history. Reviewed medications and allergies.     Objective:   Physical Exam BP 115/75 (BP Location: Right Arm, Patient Position: Sitting, Cuff Size: Large)   Pulse 82   Ht 5\' 4"  (1.626 m)   Wt 253 lb (114.8 kg)   LMP 12/10/2017   BMI 43.43 kg/m   Skin warm and dry,  Breasts:no  retraction or nipple discharge, on left but has 3 cm, round tender mass under nipple, and on right no mass, or retraction but has bloody nipple discharge expressed on slide, and she has healing boil like areas and spot where her niece bite her.     Assessment:       ICD-10-CM   1. Subareolar mass of left breast N63.42 MM DIAG BREAST TOMO BILATERAL    US BREAST LTD UNI LEFT INC AXILLA  2. Nipple discharge, bloody N64.52 US BREAST LTD UNI RIGHT INC AXILLA    MM DIAG BREAST TOMO BILATERAL      Plan:     Slide sent on right nipple discharge to pathology Scheduled bilateral mammogram and [place] at [facility] 8/20 at 2:40 pm F/U in 2 weeks Can use ice pack and advil if needed

## 2017-12-22 NOTE — Addendum Note (Signed)
Addended by: Diona Fanti A on: 12/22/2017 09:32 AM   Modules accepted: Orders

## 2017-12-24 ENCOUNTER — Telehealth: Payer: Self-pay | Admitting: Obstetrics & Gynecology

## 2017-12-24 ENCOUNTER — Telehealth: Payer: Self-pay | Admitting: Adult Health

## 2017-12-24 NOTE — Telephone Encounter (Signed)
Left message nipple discharge, was benign reactive  with inflammation, will talk after mammogram

## 2017-12-29 ENCOUNTER — Ambulatory Visit (HOSPITAL_COMMUNITY)
Admission: RE | Admit: 2017-12-29 | Discharge: 2017-12-29 | Disposition: A | Discharge status: Home / Self Care | Payer: BLUE CROSS/BLUE SHIELD | Source: Ambulatory Visit | Attending: Adult Health | Admitting: Adult Health

## 2017-12-29 ENCOUNTER — Encounter (HOSPITAL_COMMUNITY): Payer: Self-pay

## 2017-12-29 DIAGNOSIS — N6342 Unspecified lump in left breast, subareolar: Secondary | ICD-10-CM | POA: Insufficient documentation

## 2017-12-29 DIAGNOSIS — N6452 Nipple discharge: Secondary | ICD-10-CM | POA: Insufficient documentation

## 2018-01-05 ENCOUNTER — Encounter: Payer: Self-pay | Admitting: Adult Health

## 2018-01-05 ENCOUNTER — Ambulatory Visit (INDEPENDENT_AMBULATORY_CARE_PROVIDER_SITE_OTHER): Payer: BLUE CROSS/BLUE SHIELD | Admitting: Adult Health

## 2018-01-05 VITALS — BP 121/78 | HR 80 | Ht 64.0 in | Wt 256.0 lb

## 2018-01-05 DIAGNOSIS — D241 Benign neoplasm of right breast: Secondary | ICD-10-CM | POA: Diagnosis not present

## 2018-01-05 DIAGNOSIS — N6342 Unspecified lump in left breast, subareolar: Secondary | ICD-10-CM

## 2018-01-05 DIAGNOSIS — Z87898 Personal history of other specified conditions: Secondary | ICD-10-CM

## 2018-01-05 MED ORDER — AMOXICILLIN-POT CLAVULANATE 875-125 MG PO TABS: 1.0000 | ORAL_TABLET | Freq: Two times a day (BID) | ORAL | 0 refills | Status: DC

## 2018-01-05 NOTE — Progress Notes (Signed)
  Subjective:     Patient ID: Darlene Mcbride, female   DOB: [birth year], 27 y.o.   MRN: [id number]  HPI Darlene Mcbride is a 27 year old white female, back in follow up on mammogram and [place].  Review of Systems +breast pain on left No spontaneous nipple discharge Not sleeping well  Reviewed past medical,surgical, social and family history. Reviewed medications and allergies.     Objective:   Physical Exam BP 121/78 (BP Location: Left Arm, Patient Position: Sitting, Cuff Size: Large)   Pulse 80   Ht 5\' 4"  (1.626 m)   Wt 256 lb (116.1 kg)   LMP 01/01/2018   Breastfeeding? No   BMI 43.94 kg/m   Deferred breast exam, reviewed nipple pathology and mammogram and breast US. Pathology showed slight ductal atypia and acute inflammation.    IMPRESSION: 1. 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 2. 18 x 17 x 4 mm mildly heterogeneous fluid collection within thickened skin in the 6 o'clock areolar left breast. This most likely represents a Montgomery gland cyst or sebaceous cyst. No evidence of superimposed infection. 3. No explanation for the patient's clear and bloody nipple discharge on the right other than possible duct trauma due to repeated expression of fluid by the patient.  RECOMMENDATION: 1. The patient was instructed to not express any discharge on the right for the next month or two. If the clear and bloody nipple discharge continues beyond that time, then further evaluation with bilateral breast MRI with and without contrast and surgical consultation for duct excision would be recommended. 2. Right breast ultrasound in 6 months for the probable fibroadenomas in the 11 o'clock position. 3. Consideration of excision and drainage of the Montgomery gland cyst or sebaceous cyst in the 6 o'clock areolar region of the left breast due to significant pain associated with this cyst described by the patient. Will Rx Augmentin and recheck breasts in 3  weeks, do not squeeze on push on breast tissue.  Assessment:     1. Subareolar mass of left breast   2. History of nipple discharge   3. Fibroadenoma of right breast       Plan:     Meds ordered this encounter  Medications  . amoxicillin-clavulanate (AUGMENTIN) 875-125 MG tablet    Sig: Take 1 tablet by mouth 2 (two) times daily.    Dispense:  20 tablet    Refill:  0    Order Specific Question:   Supervising Provider    Answer:   Elonda Husky, LUTHER H [[id number]]  F/U in 3 weeks for breast exam

## 2018-01-08 ENCOUNTER — Telehealth: Payer: Self-pay

## 2018-01-08 NOTE — Telephone Encounter (Signed)
2nd attempt - [facility]   

## 2018-01-11 ENCOUNTER — Telehealth: Payer: Self-pay

## 2018-01-11 NOTE — Telephone Encounter (Signed)
Several attempts have been made to contact patient without success. Patient will be placed on the inactive list.  If services are needed again.  Please contact [facility] at [phone].    Information will be routed to the PCP and Dr. Hisada  

## 2018-01-28 ENCOUNTER — Ambulatory Visit: Payer: BLUE CROSS/BLUE SHIELD | Admitting: Adult Health

## 2018-05-26 ENCOUNTER — Encounter (HOSPITAL_COMMUNITY): Payer: Self-pay | Admitting: Emergency Medicine

## 2018-05-26 ENCOUNTER — Other Ambulatory Visit: Payer: Self-pay

## 2018-05-26 ENCOUNTER — Emergency Department (HOSPITAL_COMMUNITY): Payer: BLUE CROSS/BLUE SHIELD

## 2018-05-26 ENCOUNTER — Emergency Department (HOSPITAL_COMMUNITY)
Admission: EM | Admit: 2018-05-26 | Discharge: 2018-05-26 | Disposition: A | Discharge status: Home / Self Care | Payer: BLUE CROSS/BLUE SHIELD | Attending: Emergency Medicine | Admitting: Emergency Medicine

## 2018-05-26 DIAGNOSIS — J40 Bronchitis, not specified as acute or chronic: Secondary | ICD-10-CM

## 2018-05-26 DIAGNOSIS — F17210 Nicotine dependence, cigarettes, uncomplicated: Secondary | ICD-10-CM | POA: Insufficient documentation

## 2018-05-26 DIAGNOSIS — R05 Cough: Secondary | ICD-10-CM | POA: Diagnosis present

## 2018-05-26 MED ORDER — ALBUTEROL SULFATE HFA 108 (90 BASE) MCG/ACT IN AERS
2.0000 | INHALATION_SPRAY | Freq: Once | RESPIRATORY_TRACT | Status: AC
  Administered 2018-05-26: 2 via RESPIRATORY_TRACT
  Filled 2018-05-26: qty 6.7

## 2018-05-26 MED ORDER — DEXAMETHASONE 4 MG PO TABS
12.0000 mg | ORAL_TABLET | Freq: Once | ORAL | Status: AC
  Administered 2018-05-26: 12 mg via ORAL
  Filled 2018-05-26: qty 3

## 2018-05-26 MED ORDER — ALBUTEROL SULFATE (2.5 MG/3ML) 0.083% IN NEBU
5.0000 mg | INHALATION_SOLUTION | Freq: Once | RESPIRATORY_TRACT | Status: AC
  Administered 2018-05-26: 5 mg via RESPIRATORY_TRACT
  Filled 2018-05-26: qty 6

## 2018-05-26 MED ORDER — DEXAMETHASONE 4 MG PO TABS: 4.0000 mg | ORAL_TABLET | Freq: Two times a day (BID) | ORAL | 0 refills | Status: DC

## 2018-05-26 NOTE — ED Triage Notes (Signed)
Patient reports cough and SOB since Sunday. Productive with clear sputum.

## 2018-06-05 NOTE — ED Provider Notes (Signed)
[facility] EMERGENCY DEPARTMENT Provider Note   CSN: [id number] Arrival date & time: 05/26/18  1652     History   Chief Complaint Chief Complaint  Patient presents with  . Shortness of Breath    HPI Darlene Mcbride is a 28 y.o. female.  HPI   28 year old female with cough and shortness of breath.  Started Sunday.  Persistent since then.  Cough is occasionally productive for clearish to white sputum.  No pain.  No fevers or chills.  No unusual leg pain or swelling.  Has not tried taking anything for symptoms.  No known sick contacts.  Denies any known history of underlying lung disease.  Smoker.  Past Medical History:  Diagnosis Date  . Anxiety   . Depression   . Kidney stones   . Mental disorder   . Vaginal Pap smear, abnormal     Patient Active Problem List   Diagnosis Date Noted  . Menorrhagia with irregular cycle 09/28/2017  . Dysmenorrhea 09/28/2017  . Nipple discharge 09/28/2017  . History of abnormal cervical Pap smear 09/28/2017  . Encounter for gynecological examination with Papanicolaou smear of cervix 09/28/2017  . Depression, recurrent ([place]) 08/21/2017  . Fatigue 08/21/2017  . History of loop electrosurgical excision procedure (LEEP) 08/21/2017    Past Surgical History:  Procedure Laterality Date  . APPENDECTOMY    . LEEP    . OVARIAN CYST REMOVAL    . TUBAL LIGATION       OB History    Gravida  3   Para  3   Term      Preterm      AB      Living  3     SAB      TAB      Ectopic      Multiple      Live Births               Home Medications    Prior to Admission medications   Medication Sig Start Date End Date Taking? Authorizing Provider  dexamethasone (DECADRON) 4 MG tablet Take 1 tablet (4 mg total) by mouth 2 (two) times daily. 05/26/18   Virgel Manifold, MD    Family History Family History  Problem Relation Age of Onset  . Diabetes Mother   . Depression Mother   . Hypertension Father   . Depression Father   .  Post-traumatic stress disorder Father   . Breast cancer Maternal Grandmother   . Breast cancer Paternal Grandmother   . Brain cancer Maternal Grandfather   . Kidney cancer Maternal Grandfather   . Colon cancer Paternal Grandfather     Social History Social History   Tobacco Use  . Smoking status: Current Every Day Smoker    Types: Cigarettes  . Smokeless tobacco: Never Used  . Tobacco comment: wants to discuss options   Substance Use Topics  . Alcohol use: No  . Drug use: No     Allergies   Contrast media [iodinated diagnostic agents]   Review of Systems Review of Systems  All systems reviewed and negative, other than as noted in HPI.  Physical Exam Updated Vital Signs BP 127/70   Pulse (!) 102   Temp 98.5 F (36.9 C) (Oral)   Resp 16   LMP 05/14/2018   SpO2 96%   Physical Exam Vitals signs and nursing note reviewed.  Constitutional:      General: She is not in acute distress.  Appearance: She is well-developed.  HENT:     Head: Normocephalic and atraumatic.  Eyes:     General:        Right eye: No discharge.        Left eye: No discharge.     Conjunctiva/sclera: Conjunctivae normal.  Neck:     Musculoskeletal: Neck supple.  Cardiovascular:     Rate and Rhythm: Regular rhythm. Tachycardia present.     Heart sounds: Normal heart sounds. No murmur. No friction rub. No gallop.      Comments: Tachycardia Pulmonary:     Effort: Pulmonary effort is normal. No respiratory distress.     Breath sounds: Wheezing present.     Comments: Some expiratory wheezing noted.  Work of breathing is not significantly increased though.  She can carry on conversation without apparent difficulty. Abdominal:     General: There is no distension.     Palpations: Abdomen is soft.     Tenderness: There is no abdominal tenderness.  Musculoskeletal:        General: No tenderness.     Comments: Lower extremities symmetric as compared to each other. No calf tenderness. Negative  Homan's. No palpable cords.   Skin:    General: Skin is warm and dry.  Neurological:     Mental Status: She is alert.  Psychiatric:        Behavior: Behavior normal.        Thought Content: Thought content normal.      ED Treatments / Results  Labs (all labs ordered are listed, but only abnormal results are displayed) Labs Reviewed - No data to display  EKG None  Radiology No results found.  Procedures Procedures (including critical care time)  Medications Ordered in ED Medications  albuterol (PROVENTIL) (2.5 MG/3ML) 0.083% nebulizer solution 5 mg (5 mg Nebulization Given 05/26/18 1747)  dexamethasone (DECADRON) tablet 12 mg (12 mg Oral Given 05/26/18 2004)  albuterol (PROVENTIL HFA;VENTOLIN HFA) 108 (90 Base) MCG/ACT inhaler 2 puff (2 puffs Inhalation Given 05/26/18 2004)     Initial Impression / Assessment and Plan / ED Course  I have reviewed the triage vital signs and the nursing notes.  Pertinent labs & imaging results that were available during my care of the patient were reviewed by me and considered in my medical decision making (see chart for details).     28 year old female with likely viral bronchitis.  Some wheezing on exam.  No significant respiratory distress though.  She was provided with albuterol inhaler.  Steroids for a few days.  Return precautions discussed.  Final Clinical Impressions(s) / ED Diagnoses   Final diagnoses:  Bronchitis    ED Discharge Orders         Ordered    dexamethasone (DECADRON) 4 MG tablet  2 times daily     05/26/18 2007           Virgel Manifold, MD 06/05/18 [phone]

## 2018-07-14 ENCOUNTER — Emergency Department (HOSPITAL_COMMUNITY)
Admission: EM | Admit: 2018-07-14 | Discharge: 2018-07-14 | Disposition: A | Discharge status: Home / Self Care | Payer: BLUE CROSS/BLUE SHIELD | Attending: Emergency Medicine | Admitting: Emergency Medicine

## 2018-07-14 ENCOUNTER — Emergency Department (HOSPITAL_COMMUNITY): Payer: BLUE CROSS/BLUE SHIELD

## 2018-07-14 ENCOUNTER — Encounter (HOSPITAL_COMMUNITY): Payer: Self-pay | Admitting: Emergency Medicine

## 2018-07-14 ENCOUNTER — Other Ambulatory Visit: Payer: Self-pay

## 2018-07-14 DIAGNOSIS — F17210 Nicotine dependence, cigarettes, uncomplicated: Secondary | ICD-10-CM | POA: Diagnosis not present

## 2018-07-14 DIAGNOSIS — M25571 Pain in right ankle and joints of right foot: Secondary | ICD-10-CM | POA: Diagnosis not present

## 2018-07-14 MED ORDER — DICLOFENAC SODIUM 50 MG PO TBEC: 50.0000 mg | DELAYED_RELEASE_TABLET | Freq: Two times a day (BID) | ORAL | 0 refills | Status: DC

## 2018-07-14 NOTE — Discharge Instructions (Signed)
Return if any problems.

## 2018-07-14 NOTE — ED Triage Notes (Signed)
PT states she turned/twisted her right ankle on her son's field trip yesterday. PT states more pain and swelling this am.

## 2018-07-14 NOTE — ED Provider Notes (Signed)
[facility] EMERGENCY DEPARTMENT Provider Note   CSN: [id number] Arrival date & time: 07/14/18  1050    History   Chief Complaint Chief Complaint  Patient presents with  . Ankle Pain    HPI Darlene Mcbride is a 28 y.o. female.     The history is provided by the patient. No language interpreter was used.  Ankle Pain  Location:  Ankle Time since incident:  1 day Injury: yes   Mechanism of injury: fall   Fall:    Height of fall:  Standing Ankle location:  R ankle Pain details:    Quality:  Aching   Radiates to:  Does not radiate   Severity:  Moderate   Onset quality:  Gradual   Timing:  Constant   Progression:  Worsening Prior injury to area:  No Relieved by:  Nothing Ineffective treatments:  None tried Associated symptoms: decreased ROM and swelling     Past Medical History:  Diagnosis Date  . Anxiety   . Depression   . Kidney stones   . Mental disorder   . Vaginal Pap smear, abnormal     Patient Active Problem List   Diagnosis Date Noted  . Menorrhagia with irregular cycle 09/28/2017  . Dysmenorrhea 09/28/2017  . Nipple discharge 09/28/2017  . History of abnormal cervical Pap smear 09/28/2017  . Encounter for gynecological examination with Papanicolaou smear of cervix 09/28/2017  . Depression, recurrent ([place]) 08/21/2017  . Fatigue 08/21/2017  . History of loop electrosurgical excision procedure (LEEP) 08/21/2017    Past Surgical History:  Procedure Laterality Date  . APPENDECTOMY    . LEEP    . OVARIAN CYST REMOVAL    . TUBAL LIGATION       OB History    Gravida  3   Para  3   Term      Preterm      AB      Living  3     SAB      TAB      Ectopic      Multiple      Live Births               Home Medications    Prior to Admission medications   Medication Sig Start Date End Date Taking? Authorizing Provider  dexamethasone (DECADRON) 4 MG tablet Take 1 tablet (4 mg total) by mouth 2 (two) times daily. 05/26/18   Virgel Manifold, MD  diclofenac (VOLTAREN) 50 MG EC tablet Take 1 tablet (50 mg total) by mouth 2 (two) times daily. 07/14/18   Fransico Meadow, PA-C    Family History Family History  Problem Relation Age of Onset  . Diabetes Mother   . Depression Mother   . Hypertension Father   . Depression Father   . Post-traumatic stress disorder Father   . Breast cancer Maternal Grandmother   . Breast cancer Paternal Grandmother   . Brain cancer Maternal Grandfather   . Kidney cancer Maternal Grandfather   . Colon cancer Paternal Grandfather     Social History Social History   Tobacco Use  . Smoking status: Current Every Day Smoker    Packs/day: 0.50    Types: Cigarettes  . Smokeless tobacco: Never Used  . Tobacco comment: wants to discuss options   Substance Use Topics  . Alcohol use: No  . Drug use: No     Allergies   Contrast media [iodinated diagnostic agents]   Review of Systems  Review of Systems  All other systems reviewed and are negative.    Physical Exam Updated Vital Signs BP 134/79 (BP Location: Right Arm)   Pulse 71   Temp 98 F (36.7 C) (Oral)   Resp 18   Ht 5\' 4"  (1.626 m)   Wt 113.4 kg   LMP 07/04/2018   SpO2 97%   BMI 42.91 kg/m   Physical Exam Vitals signs and nursing note reviewed.  Musculoskeletal:        General: Swelling and tenderness present. No deformity.  Skin:    General: Skin is warm.  Neurological:     General: No focal deficit present.     Mental Status: She is alert.  Psychiatric:        Mood and Affect: Mood normal.      ED Treatments / Results  Labs (all labs ordered are listed, but only abnormal results are displayed) Labs Reviewed - No data to display  EKG None  Radiology Dg Ankle Complete Right  Result Date: 07/14/2018 CLINICAL DATA:  Pain and swelling after fall. EXAM: RIGHT ANKLE - COMPLETE 3+ VIEW COMPARISON:  None FINDINGS: There is mild diffuse soft tissue swelling identified. No underlying fracture or dislocation  identified. Small plantar heel spur. No radiopaque foreign body or soft tissue calcification. IMPRESSION: 1. Soft tissue swelling. 2. No acute osseous findings. Electronically Signed   By: Kerby Moors M.D.   On: 07/14/2018 11:53    Procedures Procedures (including critical care time)  Medications Ordered in ED Medications - No data to display   Initial Impression / Assessment and Plan / ED Course  I have reviewed the triage vital signs and the nursing notes.  Pertinent labs & imaging results that were available during my care of the patient were reviewed by me and considered in my medical decision making (see chart for details).        MDM  Xray reviewed and discussed with pt.  No fracture.  I suspect sprain.   Pt placed in aso and given crutches.   Pt advised to follow up with aso if pain persist   Final Clinical Impressions(s) / ED Diagnoses   Final diagnoses:  Acute right ankle pain    ED Discharge Orders         Ordered    diclofenac (VOLTAREN) 50 MG EC tablet  2 times daily     07/14/18 1219        An After Visit Summary was printed and given to the patient.    Fransico Meadow, Hershal Coria 07/14/18 1334    Davonna Belling, MD 07/14/18 [phone]

## 2019-02-23 ENCOUNTER — Emergency Department (HOSPITAL_COMMUNITY)
Admission: EM | Admit: 2019-02-23 | Discharge: 2019-02-23 | Disposition: A | Discharge status: Home / Self Care | Payer: BC Managed Care – PPO | Attending: Emergency Medicine | Admitting: Emergency Medicine

## 2019-02-23 ENCOUNTER — Other Ambulatory Visit: Payer: Self-pay

## 2019-02-23 ENCOUNTER — Emergency Department (HOSPITAL_COMMUNITY): Payer: BC Managed Care – PPO

## 2019-02-23 ENCOUNTER — Encounter (HOSPITAL_COMMUNITY): Payer: Self-pay | Admitting: Emergency Medicine

## 2019-02-23 DIAGNOSIS — Y9389 Activity, other specified: Secondary | ICD-10-CM | POA: Diagnosis not present

## 2019-02-23 DIAGNOSIS — Y999 Unspecified external cause status: Secondary | ICD-10-CM | POA: Insufficient documentation

## 2019-02-23 DIAGNOSIS — X500XXA Overexertion from strenuous movement or load, initial encounter: Secondary | ICD-10-CM | POA: Diagnosis not present

## 2019-02-23 DIAGNOSIS — Y92007 Garden or yard of unspecified non-institutional (private) residence as the place of occurrence of the external cause: Secondary | ICD-10-CM | POA: Diagnosis not present

## 2019-02-23 DIAGNOSIS — S93402A Sprain of unspecified ligament of left ankle, initial encounter: Secondary | ICD-10-CM

## 2019-02-23 DIAGNOSIS — S99912A Unspecified injury of left ankle, initial encounter: Secondary | ICD-10-CM | POA: Diagnosis present

## 2019-02-23 DIAGNOSIS — F17210 Nicotine dependence, cigarettes, uncomplicated: Secondary | ICD-10-CM | POA: Insufficient documentation

## 2019-02-23 MED ORDER — KETOROLAC TROMETHAMINE 60 MG/2ML IM SOLN
60.0000 mg | Freq: Once | INTRAMUSCULAR | Status: AC
  Administered 2019-02-23: 10:00:00 60 mg via INTRAMUSCULAR
  Filled 2019-02-23: qty 2

## 2019-02-23 MED ORDER — IBUPROFEN 600 MG PO TABS: 600.0000 mg | ORAL_TABLET | Freq: Four times a day (QID) | ORAL | 0 refills | Status: DC | PRN

## 2019-02-23 NOTE — ED Triage Notes (Signed)
Patient states she stepped in a hole in her yard last night around [place] a pop in left ankle.  Unable to walk on foot due to pain.

## 2019-02-23 NOTE — Discharge Instructions (Signed)
Your x-ray looks good, no signs of broken bones, you may take ibuprofen 3 times a day, keep the splint on your ankle and use crutches over the next week, see your doctor if no improvement.  It would help you to keep the leg elevated as much as possible over the next couple of days

## 2019-02-23 NOTE — ED Provider Notes (Signed)
[facility] EMERGENCY DEPARTMENT Provider Note   CSN: [id number] Arrival date & time: 02/23/19  0908     History   Chief Complaint No chief complaint on file.   HPI Darlene Mcbride is a 28 y.o. female.     HPI  This patient is a 28 year old female, she has a history of recurrent ankle injuries in fact she had a pair of crutches from an ankle injury in March which happened to be the right side.  Last night she stepped in a hole while she was chasing one of her kids in the yard, she felt a pop, heard a pop and in fact states that her friend who was at the house heard the pop from across the yard as well.  She has been nonweightbearing on that left ankle since that time.  She associates some swelling with it, she has had no medications this morning, she has been applying ice.  She was unable to bear any weight on it this morning.  The pain is slightly worsened, the swelling is slightly worsened.  She denies any knee pain or other injuries from this fall.  She reports that it is a closed injury without any abrasions lacerations or bleeding and denies any numbness of the toes.  Past Medical History:  Diagnosis Date  . Anxiety   . Depression   . Kidney stones   . Mental disorder   . Vaginal Pap smear, abnormal     Patient Active Problem List   Diagnosis Date Noted  . Menorrhagia with irregular cycle 09/28/2017  . Dysmenorrhea 09/28/2017  . Nipple discharge 09/28/2017  . History of abnormal cervical Pap smear 09/28/2017  . Encounter for gynecological examination with Papanicolaou smear of cervix 09/28/2017  . Depression, recurrent ([place]) 08/21/2017  . Fatigue 08/21/2017  . History of loop electrosurgical excision procedure (LEEP) 08/21/2017    Past Surgical History:  Procedure Laterality Date  . APPENDECTOMY    . LEEP    . OVARIAN CYST REMOVAL    . TUBAL LIGATION       OB History    Gravida  3   Para  3   Term      Preterm      AB      Living  3     SAB      TAB      Ectopic      Multiple      Live Births               Home Medications    Prior to Admission medications   Medication Sig Start Date End Date Taking? Authorizing Provider  ibuprofen (ADVIL) 600 MG tablet Take 1 tablet (600 mg total) by mouth every 6 (six) hours as needed. 02/23/19   Noemi Chapel, MD    Family History Family History  Problem Relation Age of Onset  . Diabetes Mother   . Depression Mother   . Hypertension Father   . Depression Father   . Post-traumatic stress disorder Father   . Breast cancer Maternal Grandmother   . Breast cancer Paternal Grandmother   . Brain cancer Maternal Grandfather   . Kidney cancer Maternal Grandfather   . Colon cancer Paternal Grandfather     Social History Social History   Tobacco Use  . Smoking status: Current Every Day Smoker    Packs/day: 0.50    Types: Cigarettes  . Smokeless tobacco: Never Used  . Tobacco comment: wants to discuss options  Substance Use Topics  . Alcohol use: No  . Drug use: No     Allergies   Contrast media [iodinated diagnostic agents]   Review of Systems Review of Systems  Musculoskeletal: Positive for arthralgias and joint swelling.  Skin: Negative for color change, pallor, rash and wound.  Neurological: Negative for numbness.     Physical Exam Updated Vital Signs BP (!) 128/106 (BP Location: Left Arm)   Temp 98.1 F (36.7 C) (Oral)   Resp 18   LMP 01/27/2019 (Exact Date)   SpO2 98%   Physical Exam Vitals signs and nursing note reviewed.  Constitutional:      General: She is not in acute distress.    Appearance: She is well-developed. She is not diaphoretic.  HENT:     Head: Normocephalic and atraumatic.  Eyes:     General: No scleral icterus.    Conjunctiva/sclera: Conjunctivae normal.  Cardiovascular:     Rate and Rhythm: Normal rate and regular rhythm.  Pulmonary:     Effort: Pulmonary effort is normal.     Breath sounds: Normal breath sounds.   Musculoskeletal:        General: Swelling and tenderness present. No deformity.     Right lower leg: No edema.     Left lower leg: No edema.     Comments: Focal tenderness over the left lateral malleolus with mild swelling in this area.  No tenderness over the base of the fifth metatarsal or the midfoot.  There is pain with range of motion of the ankle.  There is no pain over the proximal fibula or range of motion of the knee.  Skin:    General: Skin is warm and dry.     Findings: No rash.  Neurological:     Mental Status: She is alert.     Comments: No numbness or weakness of the foot      ED Treatments / Results  Labs (all labs ordered are listed, but only abnormal results are displayed) Labs Reviewed - No data to display  EKG None  Radiology Dg Ankle Complete Left  Result Date: 02/23/2019 CLINICAL DATA:  Trauma, ankle injury EXAM: LEFT ANKLE COMPLETE - 3+ VIEW COMPARISON:  None. FINDINGS: Well corticated round bony density adjacent to the lateral malleolus likely reflects old avulsion injury or secondary ossification center. No acute fracture, subluxation or dislocation. Mild lateral and anterior soft tissue swelling. Joint spaces maintained. IMPRESSION: No acute bony abnormality. Electronically Signed   By: Rolm Baptise M.D.   On: 02/23/2019 10:52    Procedures Procedures (including critical care time)  Medications Ordered in ED Medications  ketorolac (TORADOL) injection 60 mg (60 mg Intramuscular Given 02/23/19 0949)     Initial Impression / Assessment and Plan / ED Course  I have reviewed the triage vital signs and the nursing notes.  Pertinent labs & imaging results that were available during my care of the patient were reviewed by me and considered in my medical decision making (see chart for details).  Clinical Course as of Feb 22 1099  Wed Feb 23, 2019  1053 I have personally viewed the x-rays of the left ankle.  The ankle mortise is intact, there is a  possible displaced avulsion fracture off the tip of the lateral malleolus however no other significant obvious fracture seen of the ankle, the talar dome or the foot   [BM]  1057 Radiology has read as negative - liekly just a sprain Pt informed -s table for  d/c.   [BM]    Clinical Course User Index [BM] Noemi Chapel, MD      This patient has what appears to be an acute ankle injury with a left lateral malleoli are swelling and tenderness.  Her pulses are normal, sensation is normal, capillary refill is normal, will send for an x-ray to rule out fractures.  The patient is agreeable to the plan.  She will be given Toradol for pain, if fractured may need opiate pain control for home however at this time she is in no distress and does not appear to have any other injuries.   Stable for discharge, patient updated on results, treatment plan and reasons for return, expressed understanding  Final Clinical Impressions(s) / ED Diagnoses   Final diagnoses:  Sprain of left ankle, unspecified ligament, initial encounter    ED Discharge Orders         Ordered    ibuprofen (ADVIL) 600 MG tablet  Every 6 hours PRN     02/23/19 1058           Noemi Chapel, MD 02/23/19 1100

## 2019-08-26 ENCOUNTER — Ambulatory Visit
Admission: EM | Admit: 2019-08-26 | Discharge: 2019-08-26 | Disposition: A | Discharge status: Home / Self Care | Payer: BC Managed Care – PPO | Attending: Emergency Medicine | Admitting: Emergency Medicine

## 2019-08-26 DIAGNOSIS — S6991XA Unspecified injury of right wrist, hand and finger(s), initial encounter: Secondary | ICD-10-CM | POA: Diagnosis not present

## 2019-08-26 DIAGNOSIS — Z23 Encounter for immunization: Secondary | ICD-10-CM | POA: Diagnosis not present

## 2019-08-26 DIAGNOSIS — S61216A Laceration without foreign body of right little finger without damage to nail, initial encounter: Secondary | ICD-10-CM | POA: Diagnosis not present

## 2019-08-26 MED ORDER — CEPHALEXIN 500 MG PO CAPS: 500.0000 mg | ORAL_CAPSULE | Freq: Two times a day (BID) | ORAL | 0 refills | Status: AC

## 2019-08-26 MED ORDER — TETANUS-DIPHTH-ACELL PERTUSSIS 5-2.5-18.5 LF-MCG/0.5 IM SUSP
0.5000 mL | Freq: Once | INTRAMUSCULAR | Status: AC
  Administered 2019-08-26: 11:00:00 0.5 mL via INTRAMUSCULAR

## 2019-08-26 NOTE — Discharge Instructions (Signed)
Three sutures applied Bandage and splint applied Keep covered for next and dry for next 24-48 hours.  After then you may gently clean with warm water and mild soap.  Avoid submerging wound in water. Change dressing daily and apply a thin layer of neosporin.  Keflex prescribed to prevent infection.  Take as directed and to completion.  Return in 10-12 days to have sutures removed.   Take OTC ibuprofen or tylenol as needed for pain relief Return sooner or go to the ED if you have any new or worsening symptoms such as increased pain, redness, swelling, drainage, discharge, decreased range of motion of extremity, etc..

## 2019-08-26 NOTE — ED Provider Notes (Signed)
[place]   [id number] 08/26/19 Arrival Time: [id number]  CC: LACERATION  SUBJECTIVE:  Darlene Mcbride is a 29 y.o. female who presents with a laceration to RT little finger that occurred this morning.  Symptoms began after cutting finger on piece of glass in trash.  Bleeding controlled.  Currently not on blood thinners.  Denies similar symptoms in the past.  Denies fever, chills, nausea, vomiting, redness, swelling, purulent drainage, decrease strength or sensation.   Td UTD: Unknown.  ROS: As per HPI.  All other pertinent ROS negative.     Past Medical History:  Diagnosis Date  . Anxiety   . Depression   . Kidney stones   . Mental disorder   . Vaginal Pap smear, abnormal    Past Surgical History:  Procedure Laterality Date  . APPENDECTOMY    . LEEP    . OVARIAN CYST REMOVAL    . TUBAL LIGATION     Allergies  Allergen Reactions  . Contrast Media [Iodinated Diagnostic Agents] Hives   No current facility-administered medications on file prior to encounter.   Current Outpatient Medications on File Prior to Encounter  Medication Sig Dispense Refill  . ibuprofen (ADVIL) 600 MG tablet Take 1 tablet (600 mg total) by mouth every 6 (six) hours as needed. 30 tablet 0   Social History   Socioeconomic History  . Marital status: Married    Spouse name: Not on file  . Number of children: Not on file  . Years of education: Not on file  . Highest education level: Not on file  Occupational History  . Not on file  Tobacco Use  . Smoking status: Current Every Day Smoker    Packs/day: 0.50    Types: Cigarettes  . Smokeless tobacco: Never Used  . Tobacco comment: wants to discuss options   Substance and Sexual Activity  . Alcohol use: No  . Drug use: No  . Sexual activity: Yes    Birth control/protection: Surgical    Comment: tubal   Other Topics Concern  . Not on file  Social History Narrative   43, 73,58 year olds.    Married for 5 years.   Stay at home mom.    Eats all food groups.   Wear seatbelt.      Eye exam in 06/2017.   Enjoys time with family, read.    Social Determinants of Health   Financial Resource Strain:   . Difficulty of Paying Living Expenses:   Food Insecurity:   . Worried About Charity fundraiser in the Last Year:   . Arboriculturist in the Last Year:   Transportation Needs:   . Film/video editor (Medical):   Marland Kitchen Lack of Transportation (Non-Medical):   Physical Activity:   . Days of Exercise per Week:   . Minutes of Exercise per Session:   Stress:   . Feeling of Stress :   Social Connections:   . Frequency of Communication with Friends and Family:   . Frequency of Social Gatherings with Friends and Family:   . Attends Religious Services:   . Active Member of Clubs or Organizations:   . Attends Archivist Meetings:   Marland Kitchen Marital Status:   Intimate Partner Violence:   . Fear of Current or Ex-Partner:   . Emotionally Abused:   Marland Kitchen Physically Abused:   . Sexually Abused:    Family History  Problem Relation Age of Onset  . Diabetes Mother   .  Depression Mother   . Hypertension Father   . Depression Father   . Post-traumatic stress disorder Father   . Breast cancer Maternal Grandmother   . Breast cancer Paternal Grandmother   . Brain cancer Maternal Grandfather   . Kidney cancer Maternal Grandfather   . Colon cancer Paternal Grandfather      OBJECTIVE:  Vitals:   08/26/19 0952  BP: (!) 148/84  Pulse: (!) 103  Resp: 17  Temp: 97.8 F (36.6 C)  TempSrc: Oral  SpO2: 98%     General appearance: alert; no distress CV: Radial pulse 2+; cap refill < 2 seconds Skin: laceration of RT fifth digit size: approx 1-2 cm, bleeding controlled; strength and sensation intact prior and after laceration repair Psychological: alert and cooperative; normal mood and affect  Procedure: Verbal consent obtained. Patient provided with risks and alternatives to the procedure. Wound copiously irrigated with tap  water then cleansed with betadine. Anesthetized with 4 mL of lidocaine without epinephrine. Wound carefully explored. No foreign body, tendon injury, or nonviable tissue were noted. Using sterile technique 1 horizontal and 2 interrupted 4-0 Prolene sutures were placed to reapproximate the wound. Patient tolerated procedure well. No complications. Minimal bleeding. Patient advised to look for and return for any signs of infection such as redness, swelling, discharge, or worsening pain. Return for suture removal in 10-12 days.  ASSESSMENT & PLAN:  1. Laceration of right little finger without foreign body without damage to nail, initial encounter     Meds ordered this encounter  Medications  . cephALEXin (KEFLEX) 500 MG capsule    Sig: Take 1 capsule (500 mg total) by mouth 2 (two) times daily for 10 days.    Dispense:  20 capsule    Refill:  0    Order Specific Question:   Supervising Provider    Answer:   Raylene Everts [id number]  . Tdap (BOOSTRIX) injection 0.5 mL   Three sutures applied Bandage and splint applied Keep covered for next and dry for next 24-48 hours.  After then you may gently clean with warm water and mild soap.  Avoid submerging wound in water. Change dressing daily and apply a thin layer of neosporin.  Keflex prescribed to prevent infection.  Take as directed and to completion.  Return in 10-12 days to have sutures removed.   Take OTC ibuprofen or tylenol as needed for pain relief Return sooner or go to the ED if you have any new or worsening symptoms such as increased pain, redness, swelling, drainage, discharge, decreased range of motion of extremity, etc..     Reviewed expectations re: course of current medical issues. Questions answered. Outlined signs and symptoms indicating need for more acute intervention. Patient verbalized understanding. After Visit Summary given.   Lestine Box, PA-C 08/26/19 1022

## 2019-08-26 NOTE — ED Triage Notes (Signed)
Pt presents with complaints of laceration to her right pinky finger. Reports she was pushing the trash down and a piece of glass in the trash can cut her. Bleeding is controlled.

## 2020-03-29 ENCOUNTER — Emergency Department (HOSPITAL_COMMUNITY)
Admission: EM | Admit: 2020-03-29 | Discharge: 2020-03-29 | Disposition: A | Discharge status: Home / Self Care | Payer: BC Managed Care – PPO | Attending: Emergency Medicine | Admitting: Emergency Medicine

## 2020-03-29 ENCOUNTER — Emergency Department (HOSPITAL_COMMUNITY): Payer: BC Managed Care – PPO

## 2020-03-29 ENCOUNTER — Other Ambulatory Visit: Payer: Self-pay

## 2020-03-29 ENCOUNTER — Encounter (HOSPITAL_COMMUNITY): Payer: Self-pay | Admitting: *Deleted

## 2020-03-29 DIAGNOSIS — R102 Pelvic and perineal pain: Secondary | ICD-10-CM | POA: Insufficient documentation

## 2020-03-29 DIAGNOSIS — B9689 Other specified bacterial agents as the cause of diseases classified elsewhere: Secondary | ICD-10-CM | POA: Insufficient documentation

## 2020-03-29 DIAGNOSIS — N760 Acute vaginitis: Secondary | ICD-10-CM | POA: Diagnosis not present

## 2020-03-29 DIAGNOSIS — F17210 Nicotine dependence, cigarettes, uncomplicated: Secondary | ICD-10-CM | POA: Insufficient documentation

## 2020-03-29 LAB — COMPREHENSIVE METABOLIC PANEL
ALT: 26 U/L (ref 0–44)
AST: 16 U/L (ref 15–41)
Albumin: 4 g/dL (ref 3.5–5.0)
Alkaline Phosphatase: 63 U/L (ref 38–126)
Anion gap: 7 (ref 5–15)
BUN: 7 mg/dL (ref 6–20)
CO2: 26 mmol/L (ref 22–32)
Calcium: 8.9 mg/dL (ref 8.9–10.3)
Chloride: 104 mmol/L (ref 98–111)
Creatinine, Ser: 0.59 mg/dL (ref 0.44–1.00)
GFR, Estimated: >60 mL/min (ref >60)
Glucose, Bld: 96 mg/dL (ref 70–99)
Potassium: 3.6 mmol/L (ref 3.5–5.1)
Sodium: 137 mmol/L (ref 135–145)
Total Bilirubin: 0.5 mg/dL (ref 0.3–1.2)
Total Protein: 7.7 g/dL (ref 6.5–8.1)

## 2020-03-29 LAB — URINALYSIS, ROUTINE W REFLEX MICROSCOPIC
Bilirubin Urine: NEGATIVE
Glucose, UA: NEGATIVE mg/dL
Ketones, ur: NEGATIVE mg/dL
Nitrite: NEGATIVE
Protein, ur: 30 mg/dL — AB
Specific Gravity, Urine: 1.02 (ref 1.005–1.030)
WBC, UA: >50 WBC/hpf — ABNORMAL HIGH (ref 0–5)
pH: 5 (ref 5.0–8.0)

## 2020-03-29 LAB — CBC
HCT: 42.1 % (ref 36.0–46.0)
Hemoglobin: 13.7 g/dL (ref 12.0–15.0)
MCH: 32.3 pg (ref 26.0–34.0)
MCHC: 32.5 g/dL (ref 30.0–36.0)
MCV: 99.3 fL (ref 80.0–100.0)
Platelets: 370 10*3/uL (ref 150–400)
RBC: 4.24 MIL/uL (ref 3.87–5.11)
RDW: 13.2 % (ref 11.5–15.5)
WBC: 12.3 10*3/uL — ABNORMAL HIGH (ref 4.0–10.5)
nRBC: 0 % (ref 0.0–0.2)

## 2020-03-29 LAB — PREGNANCY, URINE: Preg Test, Ur: NEGATIVE

## 2020-03-29 LAB — WET PREP, GENITAL
Sperm: NONE SEEN
Trich, Wet Prep: NONE SEEN
Yeast Wet Prep HPF POC: NONE SEEN

## 2020-03-29 LAB — LIPASE, BLOOD: Lipase: 20 U/L (ref 11–51)

## 2020-03-29 MED ORDER — METRONIDAZOLE 500 MG PO TABS: 500.0000 mg | ORAL_TABLET | Freq: Two times a day (BID) | ORAL | 0 refills | Status: DC

## 2020-03-29 MED ORDER — KETOROLAC TROMETHAMINE 30 MG/ML IJ SOLN
30.0000 mg | Freq: Once | INTRAMUSCULAR | Status: AC
  Administered 2020-03-29: 30 mg via INTRAVENOUS
  Filled 2020-03-29: qty 1

## 2020-03-29 MED ORDER — SODIUM CHLORIDE 0.9 % IV BOLUS
1000.0000 mL | Freq: Once | INTRAVENOUS | Status: AC
  Administered 2020-03-29: 1000 mL via INTRAVENOUS

## 2020-03-29 MED ORDER — MORPHINE SULFATE (PF) 4 MG/ML IV SOLN
4.0000 mg | Freq: Once | INTRAVENOUS | Status: AC
  Administered 2020-03-29: 4 mg via INTRAVENOUS
  Filled 2020-03-29: qty 1

## 2020-03-29 MED ORDER — MEGESTROL ACETATE 40 MG PO TABS: 40.0000 mg | ORAL_TABLET | Freq: Every day | ORAL | 1 refills | Status: AC

## 2020-03-29 MED ORDER — ONDANSETRON HCL 4 MG/2ML IJ SOLN
4.0000 mg | Freq: Once | INTRAMUSCULAR | Status: AC
  Administered 2020-03-29: 4 mg via INTRAVENOUS
  Filled 2020-03-29: qty 2

## 2020-03-29 NOTE — ED Notes (Signed)
PO water given, pt tolerated well.

## 2020-03-29 NOTE — ED Provider Notes (Signed)
[facility] EMERGENCY DEPARTMENT Provider Note   CSN: [id number] Arrival date & time: 03/29/20  1105     History Chief Complaint  Patient presents with  . Abdominal Pain    Darlene Mcbride is a 29 y.o. female with past medical history significant for anxiety, depression, kidney stones, abnormal Pap smear, menorrhagia with irregular cycle. Surgical history includes appendectomy, ovarian cyst removal and tubal ligation.  HPI Patient presents to emergency room today with chief complaint of abdominal pain x 3 days. She tells me the pain is located in her pelvis not her abdomen. She describes the pain as a cramping sensation. She states it is constant and has been progressively worsening. She rates pain 8/10 in severity. She reports the pain is worse on the left compared to the right. It radiates across her pelvis. She has not tried any medications for her symptoms at home. She states menses cycle ended x 2 days ago. She has had some vaginal spotting with this pain, not soaking through pads or passing clots. She is also endorsing vaginal pain, she states she feels a knot on the inside of her vagina and as if the inside is swollen. Denies history of similar pain. Denies history of STIs, is only sexually active with her husband. Denies fever, chills, cough, chest pain, back pain, vaginal discharge, gross hematuria, dysuria, diarrhea.     Past Medical History:  Diagnosis Date  . Anxiety   . Depression   . Kidney stones   . Mental disorder   . Vaginal Pap smear, abnormal     Patient Active Problem List   Diagnosis Date Noted  . Menorrhagia with irregular cycle 09/28/2017  . Dysmenorrhea 09/28/2017  . Nipple discharge 09/28/2017  . History of abnormal cervical Pap smear 09/28/2017  . Encounter for gynecological examination with Papanicolaou smear of cervix 09/28/2017  . Depression, recurrent ([place]) 08/21/2017  . Fatigue 08/21/2017  . History of loop electrosurgical excision procedure  (LEEP) 08/21/2017    Past Surgical History:  Procedure Laterality Date  . APPENDECTOMY    . LEEP    . OVARIAN CYST REMOVAL    . TUBAL LIGATION       OB History    Gravida  3   Para  3   Term      Preterm      AB      Living  3     SAB      TAB      Ectopic      Multiple      Live Births              Family History  Problem Relation Age of Onset  . Diabetes Mother   . Depression Mother   . Hypertension Father   . Depression Father   . Post-traumatic stress disorder Father   . Breast cancer Maternal Grandmother   . Breast cancer Paternal Grandmother   . Brain cancer Maternal Grandfather   . Kidney cancer Maternal Grandfather   . Colon cancer Paternal Grandfather     Social History   Tobacco Use  . Smoking status: Current Every Day Smoker    Packs/day: 0.50    Types: Cigarettes  . Smokeless tobacco: Never Used  . Tobacco comment: wants to discuss options   Vaping Use  . Vaping Use: Never used  Substance Use Topics  . Alcohol use: No  . Drug use: No    Home Medications Prior to Admission medications  Medication Sig Start Date End Date Taking? Authorizing Provider  ALPRAZolam (XANAX) 0.25 MG tablet Take 0.25 mg by mouth daily as needed. 03/27/20   [provider]  escitalopram (LEXAPRO) 10 MG tablet Take 10 mg by mouth daily. 03/27/20   [provider]  ibuprofen (ADVIL) 600 MG tablet Take 1 tablet (600 mg total) by mouth every 6 (six) hours as needed. 02/23/19   Noemi Chapel, MD  megestrol (MEGACE) 40 MG tablet Take 1 tablet (40 mg total) by mouth daily. 03/29/20 05/28/20  Barrie Folk, PA-C  metroNIDAZOLE (FLAGYL) 500 MG tablet Take 1 tablet (500 mg total) by mouth 2 (two) times daily. 03/29/20   Barrie Folk, PA-C    Allergies    Contrast media [iodinated diagnostic agents]  Review of Systems   Review of Systems All other systems are reviewed and are negative for acute change except as noted in  the HPI.  Physical Exam Updated Vital Signs BP 117/73 (BP Location: Right Arm)   Pulse 88   Temp 98.3 F (36.8 C) (Oral)   Resp 20   Ht 5\' 4"  (1.626 m)   Wt 104.8 kg   LMP 03/23/2020   SpO2 98%   BMI 39.65 kg/m   Physical Exam Vitals and nursing note reviewed.  Constitutional:      General: She is not in acute distress.    Appearance: She is not ill-appearing.     Comments: uncomfortable appearing  HENT:     Head: Normocephalic and atraumatic.     Right Ear: Tympanic membrane and external ear normal.     Left Ear: Tympanic membrane and external ear normal.     Nose: Nose normal.     Mouth/Throat:     Mouth: Mucous membranes are moist.     Pharynx: Oropharynx is clear.  Eyes:     General: No scleral icterus.       Right eye: No discharge.        Left eye: No discharge.     Extraocular Movements: Extraocular movements intact.     Conjunctiva/sclera: Conjunctivae normal.     Pupils: Pupils are equal, round, and reactive to light.  Neck:     Vascular: No JVD.  Cardiovascular:     Rate and Rhythm: Normal rate and regular rhythm.     Pulses: Normal pulses.          Radial pulses are 2+ on the right side and 2+ on the left side.     Heart sounds: Normal heart sounds.  Pulmonary:     Comments: Lungs clear to auscultation in all fields. Symmetric chest rise. No wheezing, rales, or rhonchi. Abdominal:     General: Bowel sounds are normal.     Tenderness: There is no right CVA tenderness or left CVA tenderness.     Comments: Abdomen is soft, non-distended. Tender to palpation of lower right and left quadrants as well as suprapubic area. No rigidity, no guarding. No peritoneal signs.  Genitourinary:    Comments: Normal external genitalia. No pain with speculum insertion. Closed cervical os with normal appearance - no rash or lesions. No bleeding noted from cervix or in vaginal vault. Thin white discharge seen in vaginal vault. On bimanual examination no cervical motion  tenderness. + right adnexal tenderness. Chaperone Multimedia programmer present during exam.  Musculoskeletal:        General: Normal range of motion.     Cervical back: Normal range of motion.  Skin:  General: Skin is warm and dry.     Capillary Refill: Capillary refill takes less than 2 seconds.  Neurological:     Mental Status: She is oriented to person, place, and time.     GCS: GCS eye subscore is 4. GCS verbal subscore is 5. GCS motor subscore is 6.     Comments: Fluent speech, no facial droop.  Psychiatric:        Behavior: Behavior normal.     ED Results / Procedures / Treatments   Labs (all labs ordered are listed, but only abnormal results are displayed) Labs Reviewed  WET PREP, GENITAL - Abnormal; Notable for the following components:      Result Value   Clue Cells Wet Prep HPF POC FEW (*)    WBC, Wet Prep HPF POC FEW (*)    All other components within normal limits  CBC - Abnormal; Notable for the following components:   WBC 12.3 (*)    All other components within normal limits  URINALYSIS, ROUTINE W REFLEX MICROSCOPIC - Abnormal; Notable for the following components:   APPearance HAZY (*)    Hgb urine dipstick LARGE (*)    Protein, ur 30 (*)    Leukocytes,Ua SMALL (*)    WBC, UA >50 (*)    Bacteria, UA RARE (*)    All other components within normal limits  URINE CULTURE  LIPASE, BLOOD  COMPREHENSIVE METABOLIC PANEL  PREGNANCY, URINE  GC/CHLAMYDIA PROBE AMP ([place]) NOT AT [facility]    EKG None  Radiology US Transvaginal Non-OB  Result Date: 03/29/2020 CLINICAL DATA:  Pelvic pain EXAM: TRANSABDOMINAL AND TRANSVAGINAL ULTRASOUND OF PELVIS DOPPLER ULTRASOUND OF OVARIES TECHNIQUE: Study was performed transabdominally to optimize pelvic field of view evaluation and transvaginally to optimize internal visceral architecture evaluation. Color and duplex Doppler ultrasound was utilized to evaluate blood flow to the ovaries. COMPARISON:  None. FINDINGS: Uterus  Measurements: 8.1 x 4.2 x 5.0 cm = volume: 84 mL. No fibroids or other mass visualized. Endometrium Thickness: 6 mm.  No focal abnormality visualized. Right ovary Measurements: 3.3 x 2.0 x 2.0 cm = volume: 6.6 mL. There is a complex tubular shaped mass located in the right adnexal region measuring 4.5 x 3.5 x 3.7 cm. No other right-sided pelvic mass evident. Left ovary Measurements: 2.9 x 1.8 x 2.0 cm = volume: 5.6 mL. Normal appearance/no adnexal mass. Pulsed Doppler evaluation of both ovaries demonstrates normal low-resistance arterial and venous waveforms. Other findings No abnormal free fluid. IMPRESSION: 1. Somewhat tubular shaped complex but predominantly cystic appearing mass in the right adnexa measuring 4.5 x 3.5 x 3.7 cm. This appearance is felt to be most indicative of a right-sided tubo-ovarian abscess. Gynecologic assessment in this regard may well be warranted. 2. No mass or rising within either ovary. Low resistance waveform in each ovary without evidence of ovarian torsion. 3.  Uterus and endometrium appear unremarkable. Electronically Signed   By: Lowella Grip III M.D.   On: 03/29/2020 15:03   US Pelvis Complete  Result Date: 03/29/2020 CLINICAL DATA:  Pelvic pain EXAM: TRANSABDOMINAL AND TRANSVAGINAL ULTRASOUND OF PELVIS DOPPLER ULTRASOUND OF OVARIES TECHNIQUE: Study was performed transabdominally to optimize pelvic field of view evaluation and transvaginally to optimize internal visceral architecture evaluation. Color and duplex Doppler ultrasound was utilized to evaluate blood flow to the ovaries. COMPARISON:  None. FINDINGS: Uterus Measurements: 8.1 x 4.2 x 5.0 cm = volume: 84 mL. No fibroids or other mass visualized. Endometrium Thickness: 6 mm.  No  focal abnormality visualized. Right ovary Measurements: 3.3 x 2.0 x 2.0 cm = volume: 6.6 mL. There is a complex tubular shaped mass located in the right adnexal region measuring 4.5 x 3.5 x 3.7 cm. No other right-sided pelvic mass evident.  Left ovary Measurements: 2.9 x 1.8 x 2.0 cm = volume: 5.6 mL. Normal appearance/no adnexal mass. Pulsed Doppler evaluation of both ovaries demonstrates normal low-resistance arterial and venous waveforms. Other findings No abnormal free fluid. IMPRESSION: 1. Somewhat tubular shaped complex but predominantly cystic appearing mass in the right adnexa measuring 4.5 x 3.5 x 3.7 cm. This appearance is felt to be most indicative of a right-sided tubo-ovarian abscess. Gynecologic assessment in this regard may well be warranted. 2. No mass or rising within either ovary. Low resistance waveform in each ovary without evidence of ovarian torsion. 3.  Uterus and endometrium appear unremarkable. Electronically Signed   By: Lowella Grip III M.D.   On: 03/29/2020 15:03   [place] Art/Ven Flow Abd Pelv Doppler  Result Date: 03/29/2020 CLINICAL DATA:  Pelvic pain EXAM: TRANSABDOMINAL AND TRANSVAGINAL ULTRASOUND OF PELVIS DOPPLER ULTRASOUND OF OVARIES TECHNIQUE: Study was performed transabdominally to optimize pelvic field of view evaluation and transvaginally to optimize internal visceral architecture evaluation. Color and duplex Doppler ultrasound was utilized to evaluate blood flow to the ovaries. COMPARISON:  None. FINDINGS: Uterus Measurements: 8.1 x 4.2 x 5.0 cm = volume: 84 mL. No fibroids or other mass visualized. Endometrium Thickness: 6 mm.  No focal abnormality visualized. Right ovary Measurements: 3.3 x 2.0 x 2.0 cm = volume: 6.6 mL. There is a complex tubular shaped mass located in the right adnexal region measuring 4.5 x 3.5 x 3.7 cm. No other right-sided pelvic mass evident. Left ovary Measurements: 2.9 x 1.8 x 2.0 cm = volume: 5.6 mL. Normal appearance/no adnexal mass. Pulsed Doppler evaluation of both ovaries demonstrates normal low-resistance arterial and venous waveforms. Other findings No abnormal free fluid. IMPRESSION: 1. Somewhat tubular shaped complex but predominantly cystic appearing mass in the right  adnexa measuring 4.5 x 3.5 x 3.7 cm. This appearance is felt to be most indicative of a right-sided tubo-ovarian abscess. Gynecologic assessment in this regard may well be warranted. 2. No mass or rising within either ovary. Low resistance waveform in each ovary without evidence of ovarian torsion. 3.  Uterus and endometrium appear unremarkable. Electronically Signed   By: Lowella Grip III M.D.   On: 03/29/2020 15:03    Procedures Procedures (including critical care time)  Medications Ordered in ED Medications  sodium chloride 0.9 % bolus 1,000 mL (0 mLs Intravenous Stopped 03/29/20 1618)  ondansetron (ZOFRAN) injection 4 mg (4 mg Intravenous Given 03/29/20 1310)  morphine 4 MG/ML injection 4 mg (4 mg Intravenous Given 03/29/20 1310)  ketorolac (TORADOL) 30 MG/ML injection 30 mg (30 mg Intravenous Given 03/29/20 1617)    ED Course  I have reviewed the triage vital signs and the nursing notes.  Pertinent labs & imaging results that were available during my care of the patient were reviewed by me and considered in my medical decision making (see chart for details).    MDM Rules/Calculators/A&P                          History provided by patient with additional history obtained from chart review.    Patient presents to the ED with complaints of pelvic pain. Patient nontoxic appearing, in no apparent distress although looks to be uncomfortable. Vitals  WNL. On exam patient tender to palpation of bilateral lower quadrants and suprapubic area, no peritoneal signs. Will evaluate with labs, pelvic exam and pelvic US. Analgesics, anti-emetics, and fluids administered.   Labs were collected in triage. CBC with leukocytosis of 12.3, no anemia. CMP unremarkable. Lipase within normal range. UA with signs of infection, she has small leukocytes >50 WBC. UA also shows large hemoglobinuria and 21-50 RBCs.  Patient has no urinary symptoms, urine culture sent. Urine pregnancy negative. Pelvic exam  performed with chaperone present. She has right adnexal tenderness as well as thin white discharge.Lenard Forth prep shows few clue cells and WBCs. Pelvis US shows possible cystic mass in right adnexa measuring 4.5 x 3.5 x 3.7 cm.  Consulted on-call GYN Dr. Elonda Husky who recommends patient be placed on megestrol 40 mg daily and to follow-up outpatient in office in 6 weeks.  Based on the size this is likely a hemorrhagic corpus luteal cyst. Patient's pain increased after [place], given dose of toradol. On reassessment she is tolerating PO intake. Pain is much improved. Abdominal exam is benign. Will also treat for BV with flagyl. Recommend she take ibuprofen for pain, she has some at home and does not need prescription for it. She does not wish to have prophylactic STD treatment. She will wait for test results. The patient appears reasonably screened and/or stabilized for discharge and I doubt any other medical condition or other [facility] requiring further screening, evaluation, or treatment in the ED at this time prior to discharge. The patient is safe for discharge with strict return precautions discussed. Patient agreeable with plan to follow up outpatient with GYN. Findings and plan of care discussed with supervising physician Dr. Roderic Palau.   Portions of this note were generated with [employer]. Dictation errors may occur despite best attempts at proofreading.   Final Clinical Impression(s) / ED Diagnoses Final diagnoses:  Pelvic pain  Bacterial vaginitis    Rx / DC Orders ED Discharge Orders         Ordered    megestrol (MEGACE) 40 MG tablet  Daily        03/29/20 1550    metroNIDAZOLE (FLAGYL) 500 MG tablet  2 times daily        03/29/20 1704           Barrie Folk, PA-C 03/29/20 1711    Milton Ferguson, MD 03/30/20 [phone]

## 2020-03-29 NOTE — ED Triage Notes (Signed)
Pt with generalized abd pain left greater than right since last week since starting her menses.  States she has had her tubes tied.  C/o vaginal spotting, also c/o vaginal pain.

## 2020-03-29 NOTE — Discharge Instructions (Addendum)
The ultrasound shows you have an ovarian cyst on the right side.  It is likely this will go away.  Prescription has been sent to your pharmacy for Megace. This will help with the ovarian cyst.   Prescription also sent for Flagyl. This is an antibiotic used to treat bacterial vaginosis. Take as prescribed. Do not drink alcohol while taking this medicine.  -Recommend you take ibuprofen to help with your pain. You can take 600 mg every 6 hours. Take it with food so it does not cause an upset stomach.  You can try using a heating pad to help with your pain as well.  Return to the emergency department if you have fever over 100.4 that does not go away with Tylenol or ibuprofen, uncontrollable pain or nausea, or concerning heavy vaginal bleeding

## 2020-03-30 LAB — URINE CULTURE

## 2020-03-30 LAB — GC/CHLAMYDIA PROBE AMP (~~LOC~~) NOT AT ARMC
Chlamydia: NEGATIVE
Comment: NEGATIVE
Comment: NORMAL
Neisseria Gonorrhea: POSITIVE — AB

## 2020-05-11 ENCOUNTER — Ambulatory Visit: Payer: Self-pay

## 2020-09-12 IMAGING — DX DG ANKLE COMPLETE 3+V*L*
3 series · 3 of 3 positions shown · non-contrast
Comparison: None.

CLINICAL DATA: Trauma, ankle injury

EXAM:
LEFT ANKLE COMPLETE - 3+ VIEW

[ankle ap]
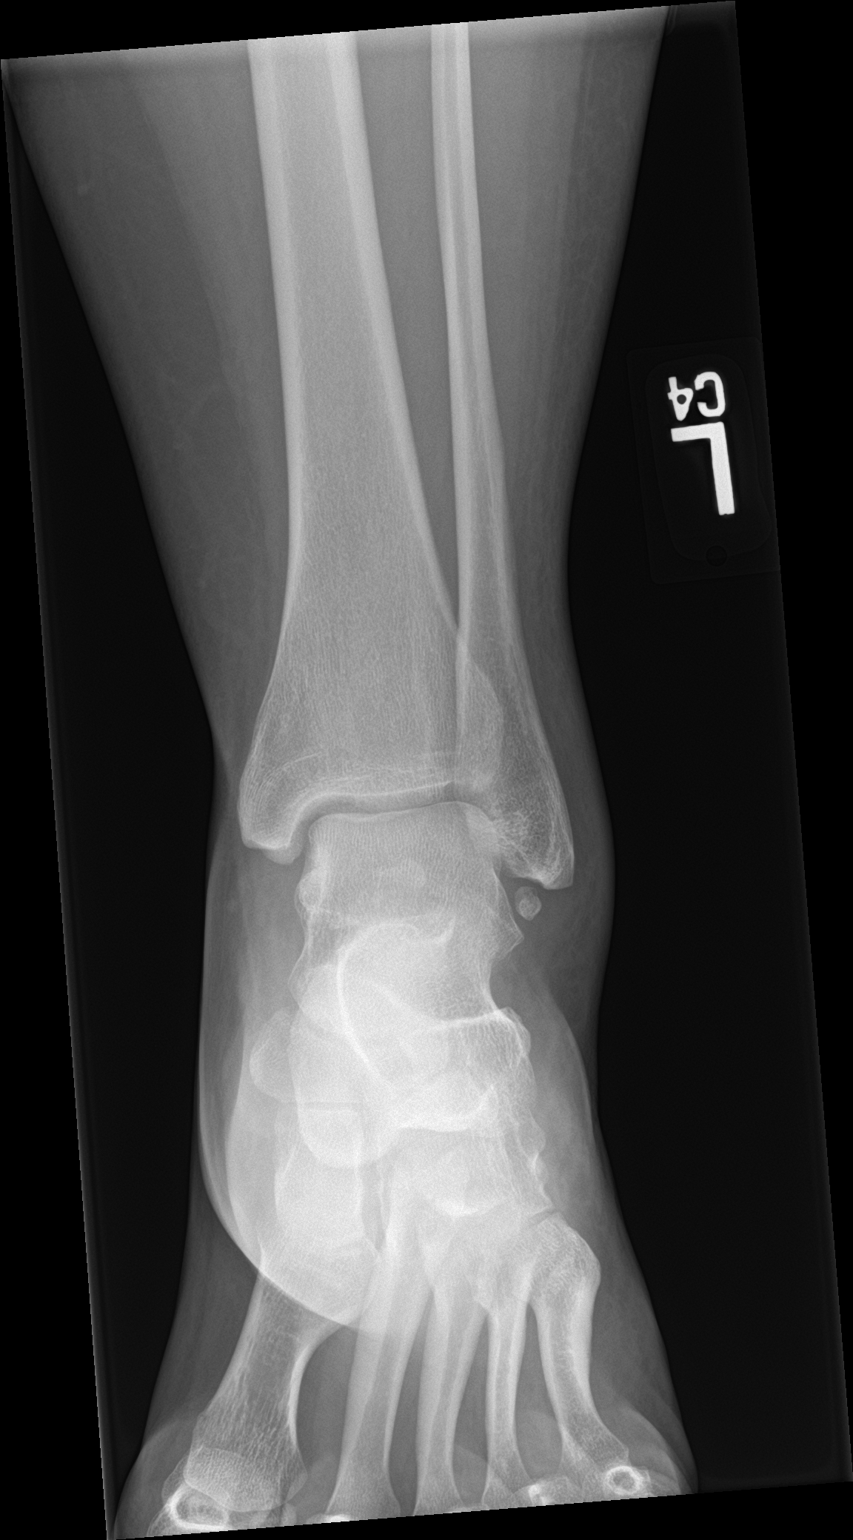

[ankle obl]
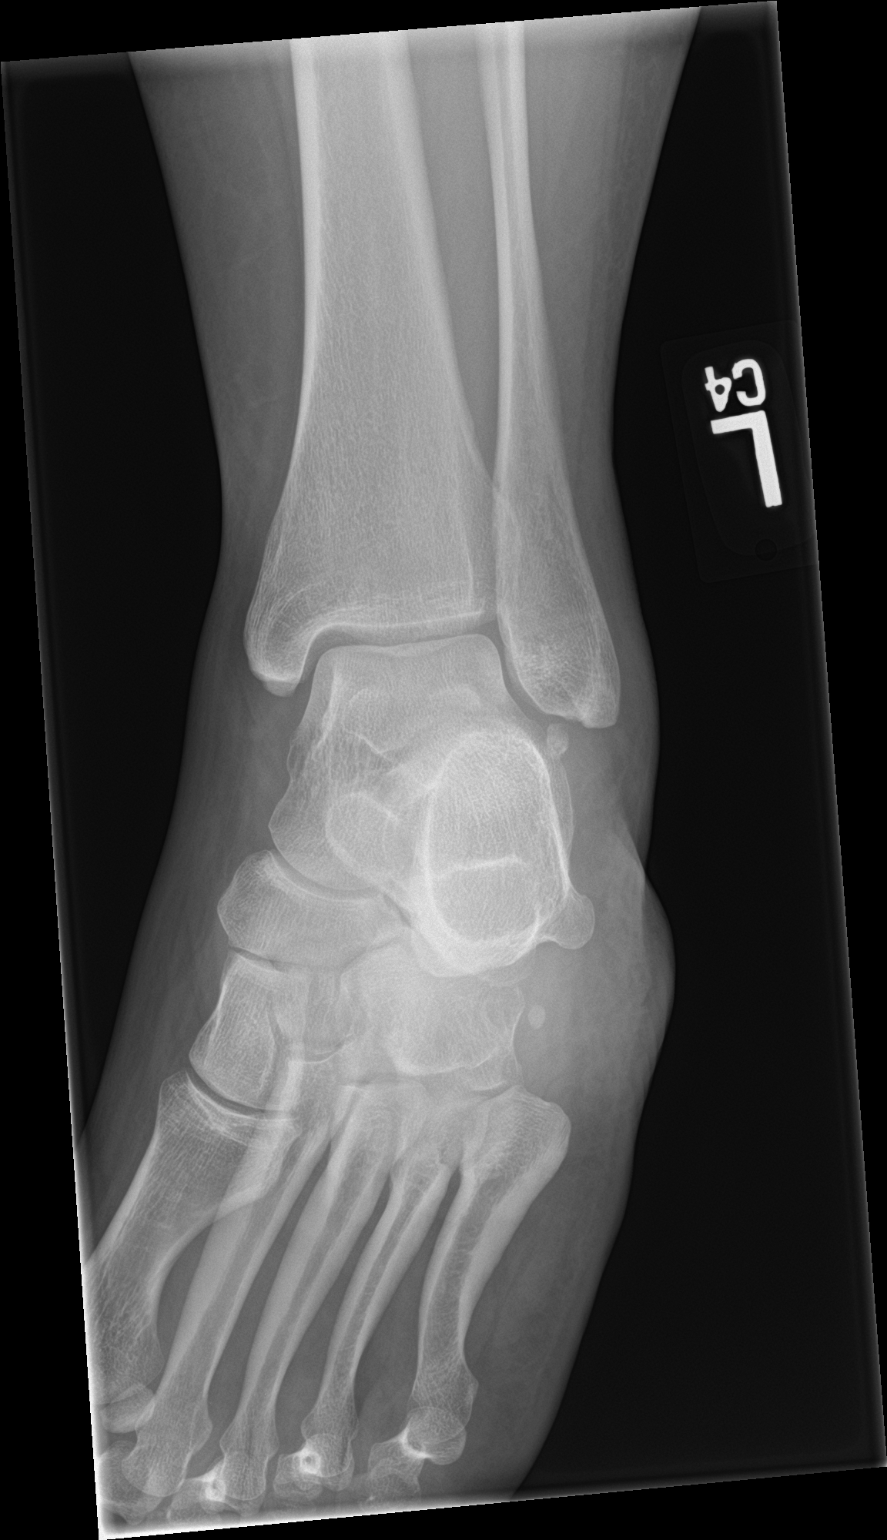

[ankle lat]
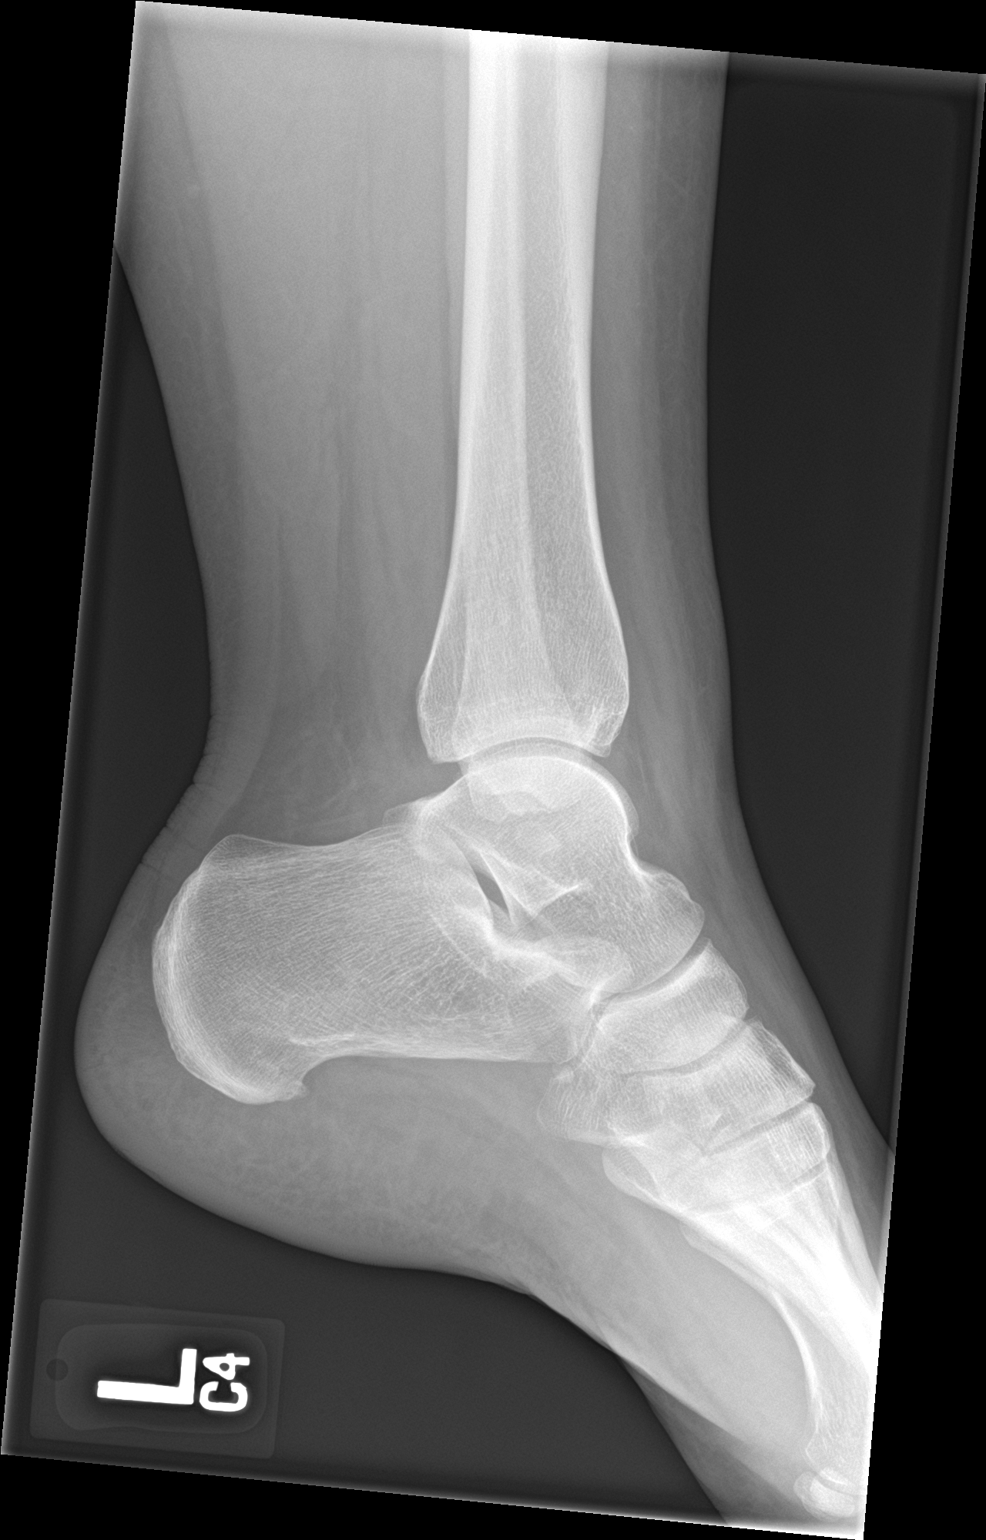

[3 of 3 positions shown; findings below may reference images not displayed]

FINDINGS: Well corticated round bony density adjacent to the lateral malleolus
likely reflects old avulsion injury or secondary ossification
center. No acute fracture, subluxation or dislocation. Mild lateral
and anterior soft tissue swelling. Joint spaces maintained.
IMPRESSION: No acute bony abnormality.

## 2022-06-12 ENCOUNTER — Ambulatory Visit
Admission: EM | Admit: 2022-06-12 | Discharge: 2022-06-12 | Disposition: A | Discharge status: Home / Self Care | Payer: Medicaid Other

## 2022-06-12 ENCOUNTER — Encounter: Payer: Self-pay | Admitting: Emergency Medicine

## 2022-06-12 DIAGNOSIS — M25511 Pain in right shoulder: Secondary | ICD-10-CM | POA: Diagnosis not present

## 2022-06-12 HISTORY — DX: Borderline personality disorder: F60.3

## 2022-06-12 MED ORDER — DEXAMETHASONE SODIUM PHOSPHATE 10 MG/ML IJ SOLN
10.0000 mg | Freq: Once | INTRAMUSCULAR | Status: AC
  Administered 2022-06-12: 10 mg via INTRAMUSCULAR

## 2022-06-12 NOTE — ED Triage Notes (Signed)
Right shoulder pain that has become worse since yesterday.  Has been taking naprosyn and muscle relaxers.  Has been having pain for 1.5 years.

## 2022-06-12 NOTE — ED Provider Notes (Signed)
[facility]    CSN: [id number] Arrival date & time: 06/12/22  1542      History   Chief Complaint Chief Complaint  Patient presents with   Shoulder Pain    Sever pain in shoulder hurts to bad ro move or hold anything with my right hand also my arm pit does not sink in lime normal - Entered by patient    HPI Darlene Mcbride is a 32 y.o. female.   Patient presenting today with acute on chronic right shoulder pain.  States this initially started about 1-1/2 years ago but has been significantly worse since yesterday.  No known injury to incite worsening symptoms per patient's knowledge.  Sometimes having some numbness and tingling down toward the hand associated but no discoloration, swelling, loss of range of motion, weakness.  Trying naproxen and muscle relaxers with minimal relief.    Past Medical History:  Diagnosis Date   Anxiety    Borderline personality disorder ([place])    Depression    Kidney stones    Mental disorder    Vaginal Pap smear, abnormal     Patient Active Problem List   Diagnosis Date Noted   Menorrhagia with irregular cycle 09/28/2017   Dysmenorrhea 09/28/2017   Nipple discharge 09/28/2017   History of abnormal cervical Pap smear 09/28/2017   Encounter for gynecological examination with Papanicolaou smear of cervix 09/28/2017   Depression, recurrent ([place]) 08/21/2017   Fatigue 08/21/2017   History of loop electrosurgical excision procedure (LEEP) 08/21/2017    Past Surgical History:  Procedure Laterality Date   APPENDECTOMY     LEEP     OVARIAN CYST REMOVAL     TUBAL LIGATION      OB History     Gravida  3   Para  3   Term      Preterm      AB      Living  3      SAB      IAB      Ectopic      Multiple      Live Births               Home Medications    Prior to Admission medications   Medication Sig Start Date End Date Taking? Authorizing Provider  baclofen (LIORESAL) 10 MG tablet Take 5 mg by mouth 2  (two) times daily.   Yes [provider]  buPROPion (WELLBUTRIN) 100 MG tablet Take 100 mg by mouth daily.   Yes [provider]  clonazePAM (KLONOPIN) 0.5 MG tablet Take 0.5 mg by mouth daily.   Yes [provider]  naproxen (NAPROSYN) 250 MG tablet Take 250 mg by mouth 2 (two) times daily with a meal.   Yes [provider]  ALPRAZolam (XANAX) 0.25 MG tablet Take 0.25 mg by mouth daily as needed. 03/27/20   [provider]  escitalopram (LEXAPRO) 10 MG tablet Take 10 mg by mouth daily. 03/27/20   [provider]  ibuprofen (ADVIL) 600 MG tablet Take 1 tablet (600 mg total) by mouth every 6 (six) hours as needed. 02/23/19   Noemi Chapel, MD  metroNIDAZOLE (FLAGYL) 500 MG tablet Take 1 tablet (500 mg total) by mouth 2 (two) times daily. 03/29/20   Barrie Folk, PA-C    Family History Family History  Problem Relation Age of Onset   Diabetes Mother    Depression Mother    Hypertension Father    Depression  Father    Post-traumatic stress disorder Father    Breast cancer Maternal Grandmother    Breast cancer Paternal Grandmother    Brain cancer Maternal Grandfather    Kidney cancer Maternal Grandfather    Colon cancer Paternal Grandfather     Social History Social History   Tobacco Use   Smoking status: Every Day    Packs/day: 0.50    Types: Cigarettes   Smokeless tobacco: Never   Tobacco comments:    wants to discuss options   Vaping Use   Vaping Use: Every day  Substance Use Topics   Alcohol use: No   Drug use: No     Allergies   Contrast media [iodinated contrast media]   Review of Systems Review of Systems PER HPI  Physical Exam Triage Vital Signs ED Triage Vitals [06/12/22 1552]  Enc Vitals Group     BP (!) 151/89     Pulse Rate (!) 108     Resp 18     Temp 97.6 F (36.4 C)     Temp Source Oral     SpO2 96 %     Weight      Height      Head Circumference      Peak Flow      Pain Score 5      Pain Loc      Pain Edu?      Excl. in [place]?    No data found.  Updated Vital Signs BP (!) 151/89 (BP Location: Left Arm)   Pulse (!) 108   Temp 97.6 F (36.4 C) (Oral)   Resp 18   LMP 05/26/2022 (Exact Date)   SpO2 96%   Visual Acuity Right Eye Distance:   Left Eye Distance:   Bilateral Distance:    Right Eye Near:   Left Eye Near:    Bilateral Near:     Physical Exam Vitals and nursing note reviewed.  Constitutional:      Appearance: Normal appearance. She is not ill-appearing.  HENT:     Head: Atraumatic.  Eyes:     Extraocular Movements: Extraocular movements intact.     Conjunctiva/sclera: Conjunctivae normal.  Cardiovascular:     Rate and Rhythm: Normal rate and regular rhythm.     Heart sounds: Normal heart sounds.  Pulmonary:     Effort: Pulmonary effort is normal.     Breath sounds: Normal breath sounds.  Musculoskeletal:        General: Tenderness present. No swelling or deformity. Normal range of motion.     Cervical back: Normal range of motion and neck supple.     Comments: Range of motion intact but significantly painful to the right shoulder.  Significant tenderness to palpation in the subscapular region right upper back as well as anterior and lateral shoulder  Skin:    General: Skin is warm and dry.     Findings: No bruising or erythema.  Neurological:     Mental Status: She is alert and oriented to person, place, and time.     Motor: No weakness.     Gait: Gait normal.     Comments: Right upper extremity neurovascularly intact  Psychiatric:        Mood and Affect: Mood normal.        Thought Content: Thought content normal.        Judgment: Judgment normal.      UC Treatments / Results  Labs (all labs ordered are listed, but  only abnormal results are displayed) Labs Reviewed - No data to display  EKG   Radiology No results found.  Procedures Procedures (including critical care time)  Medications Ordered in UC Medications   dexamethasone (DECADRON) injection 10 mg (10 mg Intramuscular Given 06/12/22 1632)    Initial Impression / Assessment and Plan / UC Course  I have reviewed the triage vital signs and the nursing notes.  Pertinent labs & imaging results that were available during my care of the patient were reviewed by me and considered in my medical decision making (see chart for details).     Suspect strain/nerve impingement causing her pain.  Treat with IM Decadron, muscle relaxers, heat, massage, stretches, rest.  Ortho follow-up if not resolving.  Final Clinical Impressions(s) / UC Diagnoses   Final diagnoses:  Acute pain of right shoulder   Discharge Instructions   None    ED Prescriptions   None    PDMP not reviewed this encounter.   Volney American, [place] 06/12/22 [phone]

## 2022-08-13 ENCOUNTER — Ambulatory Visit
Admission: EM | Admit: 2022-08-13 | Discharge: 2022-08-13 | Disposition: A | Discharge status: Home / Self Care | Payer: Medicaid Other | Attending: Nurse Practitioner | Admitting: Nurse Practitioner

## 2022-08-13 ENCOUNTER — Ambulatory Visit (INDEPENDENT_AMBULATORY_CARE_PROVIDER_SITE_OTHER): Payer: Medicaid Other

## 2022-08-13 DIAGNOSIS — M25511 Pain in right shoulder: Secondary | ICD-10-CM

## 2022-08-13 DIAGNOSIS — G8929 Other chronic pain: Secondary | ICD-10-CM

## 2022-08-13 MED ORDER — METHOCARBAMOL 500 MG PO TABS: 500.0000 mg | ORAL_TABLET | Freq: Two times a day (BID) | ORAL | 0 refills | Status: DC

## 2022-08-13 MED ORDER — DEXAMETHASONE SODIUM PHOSPHATE 10 MG/ML IJ SOLN
10.0000 mg | INTRAMUSCULAR | Status: AC
  Administered 2022-08-13: 10 mg via INTRAMUSCULAR

## 2022-08-13 NOTE — ED Provider Notes (Signed)
[facility]    CSN: [id number] Arrival date & time: 08/13/22  1700      History   Chief Complaint Chief Complaint  Patient presents with   Shoulder Pain    nerve entrapment causeing me a lot of pain in my arm and hand. Also tingling and sleep feeling - Entered by patient    HPI Darlene Mcbride is a 32 y.o. female.   The history is provided by the patient.   The patient presents for complaints of right shoulder pain.  Patient states symptoms started more than a year ago, she states this last episode started approximately 1 week ago.  Patient complains of pain in the "back portion" of her right shoulder.  She denies any new injury, trauma, or inciting event.  She states that she is having numbness and tingling in the right arm and hand, states that she also has some numbness in the left hand today.  She denies bruising, swelling, decreased range of motion.  Patient states she tried to use naproxen and stretches that her primary care physician gave her, with minimal relief.  Patient was seen in this clinic on 06/12/2022 for the same or similar symptoms.  She reports that the medications given to her at that time did help with her symptoms.  Past Medical History:  Diagnosis Date   Anxiety    Borderline personality disorder    Depression    Kidney stones    Mental disorder    Vaginal Pap smear, abnormal     Patient Active Problem List   Diagnosis Date Noted   Menorrhagia with irregular cycle 09/28/2017   Dysmenorrhea 09/28/2017   Nipple discharge 09/28/2017   History of abnormal cervical Pap smear 09/28/2017   Encounter for gynecological examination with Papanicolaou smear of cervix 09/28/2017   Depression, recurrent 08/21/2017   Fatigue 08/21/2017   History of loop electrosurgical excision procedure (LEEP) 08/21/2017    Past Surgical History:  Procedure Laterality Date   APPENDECTOMY     LEEP     OVARIAN CYST REMOVAL     TUBAL LIGATION      OB History      Gravida  3   Para  3   Term      Preterm      AB      Living  3      SAB      IAB      Ectopic      Multiple      Live Births               Home Medications    Prior to Admission medications   Medication Sig Start Date End Date Taking? Authorizing Provider  ALPRAZolam (XANAX) 0.25 MG tablet Take 0.25 mg by mouth daily as needed. 03/27/20   [provider]  baclofen (LIORESAL) 10 MG tablet Take 5 mg by mouth 2 (two) times daily.    [provider]  buPROPion (WELLBUTRIN) 100 MG tablet Take 100 mg by mouth daily.    [provider]  clonazePAM (KLONOPIN) 0.5 MG tablet Take 0.5 mg by mouth daily.    [provider]  escitalopram (LEXAPRO) 10 MG tablet Take 10 mg by mouth daily. 03/27/20   [provider]  ibuprofen (ADVIL) 600 MG tablet Take 1 tablet (600 mg total) by mouth every 6 (six) hours as needed. 02/23/19   Noemi Chapel, MD  metroNIDAZOLE (FLAGYL) 500 MG tablet Take 1 tablet (500  mg total) by mouth 2 (two) times daily. 03/29/20   Walisiewicz, Verline Lema E, PA-C  naproxen (NAPROSYN) 250 MG tablet Take 250 mg by mouth 2 (two) times daily with a meal.    [provider]    Family History Family History  Problem Relation Age of Onset   Diabetes Mother    Depression Mother    Hypertension Father    Depression Father    Post-traumatic stress disorder Father    Breast cancer Maternal Grandmother    Breast cancer Paternal Grandmother    Brain cancer Maternal Grandfather    Kidney cancer Maternal Grandfather    Colon cancer Paternal Grandfather     Social History Social History   Tobacco Use   Smoking status: Every Day    Packs/day: .5    Types: Cigarettes   Smokeless tobacco: Never   Tobacco comments:    wants to discuss options   Vaping Use   Vaping Use: Every day  Substance Use Topics   Alcohol use: No   Drug use: No     Allergies   Contrast media [iodinated contrast media]   Review of  Systems Review of Systems Per HPI  Physical Exam Triage Vital Signs ED Triage Vitals [08/13/22 1704]  Enc Vitals Group     BP (!) 149/83     Pulse Rate (!) 106     Resp 20     Temp 98.4 F (36.9 C)     Temp Source Oral     SpO2 98 %     Weight      Height      Head Circumference      Peak Flow      Pain Score 7     Pain Loc      Pain Edu?      Excl. in [place]?    No data found.  Updated Vital Signs BP (!) 149/83 (BP Location: Right Arm)   Pulse (!) 106   Temp 98.4 F (36.9 C) (Oral)   Resp 20   LMP 08/12/2022   SpO2 98%   Visual Acuity Right Eye Distance:   Left Eye Distance:   Bilateral Distance:    Right Eye Near:   Left Eye Near:    Bilateral Near:     Physical Exam Vitals and nursing note reviewed.  Constitutional:      General: She is not in acute distress.    Appearance: Normal appearance.  HENT:     Head: Normocephalic.  Cardiovascular:     Rate and Rhythm: Normal rate and regular rhythm.     Pulses: Normal pulses.     Heart sounds: Normal heart sounds.  Pulmonary:     Effort: Pulmonary effort is normal. No respiratory distress.     Breath sounds: Normal breath sounds. No stridor. No wheezing, rhonchi or rales.  Abdominal:     General: Bowel sounds are normal.     Palpations: Abdomen is soft.     Tenderness: There is no abdominal tenderness.  Musculoskeletal:     Right shoulder: Tenderness present. No swelling or deformity. Normal strength. Normal pulse.     Right hand: Decreased strength.     Left hand: Decreased strength.     Cervical back: Normal range of motion.     Comments: Range of motion intact but significantly painful to the right shoulder.  Significant tenderness to palpation in the subscapular region right upper back as well as anterior and lateral shoulder.  Lymphadenopathy:     Cervical: No cervical adenopathy.  Skin:    General: Skin is warm and dry.  Neurological:     General: No focal deficit present.     Mental Status:  She is alert and oriented to person, place, and time.  Psychiatric:        Mood and Affect: Mood normal.        Behavior: Behavior normal.      UC Treatments / Results  Labs (all labs ordered are listed, but only abnormal results are displayed) Labs Reviewed - No data to display  EKG   Radiology DG Shoulder Right  Result Date: 08/13/2022 CLINICAL DATA:  Right shoulder pain for 1 year. EXAM: RIGHT SHOULDER - 2+ VIEW COMPARISON:  None Available. FINDINGS: There is no evidence of fracture or dislocation. There is no evidence of arthropathy. The acromioclavicular joint space is prominent measuring 7 mm, indeterminate. Soft tissues are unremarkable. IMPRESSION: No evidence of fracture or dislocation. Prominence of the acromioclavicular joint space, with indeterminate significance. Electronically Signed   By: Fidela Salisbury M.D.   On: 08/13/2022 17:46    Procedures Procedures (including critical care time)  Medications Ordered in UC Medications  dexamethasone (DECADRON) injection 10 mg (10 mg Intramuscular Given 08/13/22 1749)    Initial Impression / Assessment and Plan / UC Course  I have reviewed the triage vital signs and the nursing notes.  Pertinent labs & imaging results that were available during my care of the patient were reviewed by me and considered in my medical decision making (see chart for details).  The patient is well-appearing, she is in no acute distress, she is hypertensive and mildly tachycardic, the patient is in no acute distress.  X-ray is negative for fracture or dislocation. Suspect patient has continued nerve impingement or inflammation of the nerves that may be impacting her pain.  Patient was treated with Decadron 10 mg IM in the clinic.  Patient was prescribed methocarbamol 500 mg as a muscle relaxer.  Commended the use of ice or heat as needed.  Patient also encouraged to continue the stretching exercises provided by her PCP.  Patient was advised that  because symptoms have not improved, recommend that she follow-up with orthopedics or with her PCP if symptoms continue to persist.  Patient is in agreement with this plan of care and verbalizes understanding.  All questions were answered.  Patient stable for discharge.   Final Clinical Impressions(s) / UC Diagnoses   Final diagnoses:  None   Discharge Instructions   None    ED Prescriptions   None    PDMP not reviewed this encounter.   Tish Men, NP 08/13/22 1754

## 2022-08-13 NOTE — Discharge Instructions (Addendum)
The x-ray was negative for fracture or dislocation, and did not specifically identify a cause of your pain. Take medication as prescribed. Gentle stretching and range of motion exercises for the right shoulder to help decrease the pain and numbness. May apply ice or heat as needed.  Apply ice for pain or swelling, heat for spasm or stiffness.  Apply for 20 minutes, remove for 1 hour, then repeat as much as possible. As discussed, because your symptoms do not appear to be resolving, recommend that you follow-up with orthopedics for further evaluation.  You can follow-up with [facility] at [phone] or with [facility] at [phone]. Follow-up as needed.

## 2022-08-13 NOTE — ED Triage Notes (Signed)
Pt reports she is having some nerve pain in her right shoulder that aches, and tingles down her arm down to her finger tips x 1 week. She states she has slight chest pain on her right side that feels tight "like a muscle that needs to pop".  Tingling now is in her left arm down to her finger tips as well. Pt denies injury.

## 2023-01-08 ENCOUNTER — Emergency Department (HOSPITAL_COMMUNITY)
Admission: EM | Admit: 2023-01-08 | Discharge: 2023-01-08 | Disposition: A | Discharge status: Home / Self Care | Payer: Medicaid Other | Attending: Emergency Medicine | Admitting: Emergency Medicine

## 2023-01-08 ENCOUNTER — Emergency Department (HOSPITAL_COMMUNITY): Payer: Medicaid Other

## 2023-01-08 ENCOUNTER — Other Ambulatory Visit: Payer: Self-pay

## 2023-01-08 ENCOUNTER — Encounter (HOSPITAL_COMMUNITY): Payer: Self-pay | Admitting: Emergency Medicine

## 2023-01-08 DIAGNOSIS — N200 Calculus of kidney: Secondary | ICD-10-CM | POA: Insufficient documentation

## 2023-01-08 DIAGNOSIS — R109 Unspecified abdominal pain: Secondary | ICD-10-CM | POA: Diagnosis present

## 2023-01-08 LAB — COMPREHENSIVE METABOLIC PANEL
ALT: 16 U/L (ref 0–44)
AST: 13 U/L — ABNORMAL LOW (ref 15–41)
Albumin: 3.9 g/dL (ref 3.5–5.0)
Alkaline Phosphatase: 60 U/L (ref 38–126)
Anion gap: 8 (ref 5–15)
BUN: 15 mg/dL (ref 6–20)
CO2: 28 mmol/L (ref 22–32)
Calcium: 8.9 mg/dL (ref 8.9–10.3)
Chloride: 102 mmol/L (ref 98–111)
Creatinine, Ser: 0.72 mg/dL (ref 0.44–1.00)
GFR, Estimated: >60 mL/min (ref >60)
Glucose, Bld: 104 mg/dL — ABNORMAL HIGH (ref 70–99)
Potassium: 4.3 mmol/L (ref 3.5–5.1)
Sodium: 138 mmol/L (ref 135–145)
Total Bilirubin: 0.7 mg/dL (ref 0.3–1.2)
Total Protein: 7.1 g/dL (ref 6.5–8.1)

## 2023-01-08 LAB — CBC
HCT: 35.9 % — ABNORMAL LOW (ref 36.0–46.0)
Hemoglobin: 11.7 g/dL — ABNORMAL LOW (ref 12.0–15.0)
MCH: 30.6 pg (ref 26.0–34.0)
MCHC: 32.6 g/dL (ref 30.0–36.0)
MCV: 94 fL (ref 80.0–100.0)
Platelets: 340 10*3/uL (ref 150–400)
RBC: 3.82 MIL/uL — ABNORMAL LOW (ref 3.87–5.11)
RDW: 14.1 % (ref 11.5–15.5)
WBC: 8.6 10*3/uL (ref 4.0–10.5)
nRBC: 0 % (ref 0.0–0.2)

## 2023-01-08 LAB — LIPASE, BLOOD: Lipase: 23 U/L (ref 11–51)

## 2023-01-08 LAB — HCG, QUANTITATIVE, PREGNANCY: hCG, Beta Chain, Quant, S: <1 m[IU]/mL

## 2023-01-08 MED ORDER — OXYCODONE-ACETAMINOPHEN 5-325 MG PO TABS: 1.0000 | ORAL_TABLET | Freq: Four times a day (QID) | ORAL | 0 refills | Status: DC | PRN

## 2023-01-08 MED ORDER — TAMSULOSIN HCL 0.4 MG PO CAPS: 0.4000 mg | ORAL_CAPSULE | Freq: Every day | ORAL | 0 refills | Status: DC

## 2023-01-08 MED ORDER — OXYCODONE-ACETAMINOPHEN 5-325 MG PO TABS
1.0000 | ORAL_TABLET | Freq: Once | ORAL | Status: AC
  Administered 2023-01-08: 1 via ORAL
  Filled 2023-01-08: qty 1

## 2023-01-08 MED ORDER — KETOROLAC TROMETHAMINE 30 MG/ML IJ SOLN
30.0000 mg | Freq: Once | INTRAMUSCULAR | Status: AC
  Administered 2023-01-08: 30 mg via INTRAVENOUS
  Filled 2023-01-08: qty 1

## 2023-01-08 MED ORDER — MORPHINE SULFATE (PF) 4 MG/ML IV SOLN
4.0000 mg | Freq: Once | INTRAVENOUS | Status: AC
  Administered 2023-01-08: 4 mg via INTRAVENOUS
  Filled 2023-01-08: qty 1

## 2023-01-08 MED ORDER — ONDANSETRON HCL 4 MG/2ML IJ SOLN
4.0000 mg | Freq: Once | INTRAMUSCULAR | Status: AC
  Administered 2023-01-08: 4 mg via INTRAVENOUS
  Filled 2023-01-08: qty 2

## 2023-01-08 MED ORDER — ONDANSETRON 4 MG PO TBDP: 4.0000 mg | ORAL_TABLET | Freq: Three times a day (TID) | ORAL | 0 refills | Status: DC | PRN

## 2023-01-08 MED ORDER — ONDANSETRON 4 MG PO TBDP
4.0000 mg | ORAL_TABLET | Freq: Once | ORAL | Status: AC | PRN
  Administered 2023-01-08: 4 mg via ORAL
  Filled 2023-01-08: qty 1

## 2023-01-08 NOTE — ED Notes (Signed)
EDP at bedside  

## 2023-01-08 NOTE — ED Notes (Signed)
Pt states that she has not urinated in two days. Then she says that she has urinated at midnight and that she cannot urinate again.

## 2023-01-08 NOTE — ED Notes (Signed)
Discharge instructions provided by edp were discussed with pt. Pt verbalized understanding with no additional questions at this time. Pt going home with husband

## 2023-01-08 NOTE — ED Provider Notes (Signed)
[facility] Provider Note   CSN: [id number] Arrival date & time: 01/08/23  4098     History  Chief Complaint  Patient presents with   Flank Pain    Darlene Mcbride is a 32 y.o. female.  HPI     This is a 32 year old female who presents with left flank pain.  Patient reports onset of left flank pain around 10:30 PM last night.  She also has noted some hematuria.  Has a history of the same with kidney stones.  Reports taking a Percocet in the waiting room with minimal relief.  Has had nausea and vomiting.  Home Medications Prior to Admission medications   Medication Sig Start Date End Date Taking? Authorizing Provider  ondansetron (ZOFRAN-ODT) 4 MG disintegrating tablet Take 1 tablet (4 mg total) by mouth every 8 (eight) hours as needed. 01/08/23  Yes Berk Pilot, Mayer Masker, MD  oxyCODONE-acetaminophen (PERCOCET/ROXICET) 5-325 MG tablet Take 1 tablet by mouth every 6 (six) hours as needed for severe pain. 01/08/23  Yes Bastian Andreoli, Mayer Masker, MD  tamsulosin (FLOMAX) 0.4 MG CAPS capsule Take 1 capsule (0.4 mg total) by mouth daily. 01/08/23  Yes Sandor Arboleda, Mayer Masker, MD  ALPRAZolam Prudy Feeler) 0.25 MG tablet Take 0.25 mg by mouth daily as needed. 03/27/20   [provider]  baclofen (LIORESAL) 10 MG tablet Take 5 mg by mouth 2 (two) times daily.    [provider]  buPROPion (WELLBUTRIN) 100 MG tablet Take 100 mg by mouth daily.    [provider]  clonazePAM (KLONOPIN) 0.5 MG tablet Take 0.5 mg by mouth daily.    [provider]  escitalopram (LEXAPRO) 10 MG tablet Take 10 mg by mouth daily. 03/27/20   [provider]  ibuprofen (ADVIL) 600 MG tablet Take 1 tablet (600 mg total) by mouth every 6 (six) hours as needed. 02/23/19   Eber Hong, MD  methocarbamol (ROBAXIN) 500 MG tablet Take 1 tablet (500 mg total) by mouth 2 (two) times daily. 08/13/22   Leath-Warren, Sadie Haber, NP  metroNIDAZOLE (FLAGYL) 500 MG  tablet Take 1 tablet (500 mg total) by mouth 2 (two) times daily. 03/29/20   Walisiewicz, Yvonna Alanis E, PA-C  naproxen (NAPROSYN) 250 MG tablet Take 250 mg by mouth 2 (two) times daily with a meal.    [provider]      Allergies    Contrast media [iodinated contrast media]    Review of Systems   Review of Systems  Constitutional:  Negative for fever.  Gastrointestinal:  Positive for nausea.  Genitourinary:  Positive for flank pain and hematuria.  All other systems reviewed and are negative.   Physical Exam Updated Vital Signs BP 124/80   Pulse 99   Temp 97.8 F (36.6 C) (Oral)   Resp 17   Ht 1.626 m (5\' 4" )   Wt 99.8 kg   LMP 12/11/2022 (Approximate) Comment: pt has had a tubal ligation  SpO2 98%   BMI 37.76 kg/m  Physical Exam Vitals and nursing note reviewed.  Constitutional:      Appearance: She is well-developed. She is obese.     Comments: Uncomfortable appearing but nontoxic  HENT:     Head: Normocephalic and atraumatic.  Eyes:     Pupils: Pupils are equal, round, and reactive to light.  Cardiovascular:     Rate and Rhythm: Normal rate and regular rhythm.     Heart sounds: Normal heart sounds.  Pulmonary:  Effort: Pulmonary effort is normal. No respiratory distress.     Breath sounds: No wheezing.  Abdominal:     Palpations: Abdomen is soft.     Tenderness: There is left CVA tenderness. There is no right CVA tenderness.  Musculoskeletal:     Cervical back: Neck supple.  Skin:    General: Skin is warm and dry.  Neurological:     Mental Status: She is alert and oriented to person, place, and time.  Psychiatric:        Mood and Affect: Mood normal.     ED Results / Procedures / Treatments   Labs (all labs ordered are listed, but only abnormal results are displayed) Labs Reviewed  COMPREHENSIVE METABOLIC PANEL - Abnormal; Notable for the following components:      Result Value   Glucose, Bld 104 (*)    AST 13 (*)    All other components  within normal limits  CBC - Abnormal; Notable for the following components:   RBC 3.82 (*)    Hemoglobin 11.7 (*)    HCT 35.9 (*)    All other components within normal limits  LIPASE, BLOOD  HCG, QUANTITATIVE, PREGNANCY  URINALYSIS, ROUTINE W REFLEX MICROSCOPIC    EKG None  Radiology CT Renal Stone Study  Result Date: 01/08/2023 CLINICAL DATA:  Left flank pain and hematuria. EXAM: CT ABDOMEN AND PELVIS WITHOUT CONTRAST TECHNIQUE: Multidetector CT imaging of the abdomen and pelvis was performed following the standard protocol without IV contrast. RADIATION DOSE REDUCTION: This exam was performed according to the departmental dose-optimization program which includes automated exposure control, adjustment of the mA and/or kV according to patient size and/or use of iterative reconstruction technique. COMPARISON:  None Available. FINDINGS: Lower chest: There is a 5 mm nodule in the left lower lobe, axial image 21. Hepatobiliary: No focal liver abnormality is seen. No gallstones, gallbladder wall thickening, or biliary dilatation. Pancreas: Unremarkable. No pancreatic ductal dilatation or surrounding inflammatory changes. Spleen: Normal in size without focal abnormality. Adrenals/Urinary Tract: The adrenal glands are within normal limits. No renal calculus bilaterally. There is mild obstructive uropathy on the left with a 4 mm calculus at the ureteropelvic junction. No obstructive uropathy on the right. The bladder is unremarkable. Stomach/Bowel: Stomach is within normal limits. Appendix is not seen and surgical changes are noted at the cecum. No evidence of bowel wall thickening, distention, or inflammatory changes. No free air or pneumatosis. Vascular/Lymphatic: Aortic atherosclerosis. No enlarged abdominal or pelvic lymph nodes. Reproductive: Uterus and bilateral adnexa are unremarkable. Other: No abdominopelvic ascites. Musculoskeletal: No acute osseous abnormality. IMPRESSION: 1. 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 2. 5 mm left lower lobe pulmonary nodule. No follow-up needed if patient is low-risk.This recommendation follows the consensus statement: Guidelines for Management of Incidental Pulmonary Nodules Detected on CT Images: From the Fleischner Society 2017; Radiology 2017; 284:228-243. Electronically Signed   By: Thornell Sartorius M.D.   On: 01/08/2023 04:49    Procedures Procedures    Medications Ordered in ED Medications  oxyCODONE-acetaminophen (PERCOCET/ROXICET) 5-325 MG per tablet 1 tablet (1 tablet Oral Given 01/08/23 0302)  ondansetron (ZOFRAN-ODT) disintegrating tablet 4 mg (4 mg Oral Given 01/08/23 0301)  morphine (PF) 4 MG/ML injection 4 mg (4 mg Intravenous Given 01/08/23 0405)  ondansetron (ZOFRAN) injection 4 mg (4 mg Intravenous Given 01/08/23 0404)  ketorolac (TORADOL) 30 MG/ML injection 30 mg (30 mg Intravenous Given 01/08/23 0504)    ED Course/  Medical Decision Making/ A&P                                 Medical Decision Making Amount and/or Complexity of Data Reviewed Labs: ordered. Radiology: ordered.  Risk Prescription drug management.   This patient presents to the ED for concern of leg pain, this involves an extensive number of treatment options, and is a complaint that carries with it a high risk of complications and morbidity.  I considered the following differential and admission for this acute, potentially life threatening condition.  The differential diagnosis includes kidney stone, UTI  MDM:    This is a 32 year old female who presents with flank pain.  History of kidney stones.  States this feels similar.  She is uncomfortable but nontoxic on exam and vital signs are reassuring.  She is afebrile.  Labs obtained and reviewed.  Creatinine preserved.  4 mm stone noted on CT.  No other complications.  Patient given fluids and pain medication.  Overall patient significantly improved.  On recheck she is  tolerating p.o.  (Labs, imaging, consults)  Labs: I Ordered, and personally interpreted labs.  The pertinent results include: CBC, CMP, lipase  Imaging Studies ordered: I ordered imaging studies including CT stone study I independently visualized and interpreted imaging. I agree with the radiologist interpretation  Additional history obtained from chart review.  External records from outside source obtained and reviewed including prior evaluations  Cardiac Monitoring: The patient was maintained on a cardiac monitor.  If on the cardiac monitor, I personally viewed and interpreted the cardiac monitored which showed an underlying rhythm of: Sinus rhythm  Reevaluation: After the interventions noted above, I reevaluated the patient and found that they have :improved  Social Determinants of Health:  lives independently  Disposition: Discharge  Co morbidities that complicate the patient evaluation  Past Medical History:  Diagnosis Date   Anxiety    Borderline personality disorder (HCC)    Depression    Kidney stones    Mental disorder    Vaginal Pap smear, abnormal      Medicines Meds ordered this encounter  Medications   oxyCODONE-acetaminophen (PERCOCET/ROXICET) 5-325 MG per tablet 1 tablet   ondansetron (ZOFRAN-ODT) disintegrating tablet 4 mg   morphine (PF) 4 MG/ML injection 4 mg   ondansetron (ZOFRAN) injection 4 mg   ketorolac (TORADOL) 30 MG/ML injection 30 mg   oxyCODONE-acetaminophen (PERCOCET/ROXICET) 5-325 MG tablet    Sig: Take 1 tablet by mouth every 6 (six) hours as needed for severe pain.    Dispense:  15 tablet    Refill:  0   tamsulosin (FLOMAX) 0.4 MG CAPS capsule    Sig: Take 1 capsule (0.4 mg total) by mouth daily.    Dispense:  30 capsule    Refill:  0   ondansetron (ZOFRAN-ODT) 4 MG disintegrating tablet    Sig: Take 1 tablet (4 mg total) by mouth every 8 (eight) hours as needed.    Dispense:  20 tablet    Refill:  0    I have reviewed the  patients home medicines and have made adjustments as needed  Problem List / ED Course: Problem List Items Addressed This Visit   None Visit Diagnoses     Kidney stone    -  Primary   Relevant Medications   oxyCODONE-acetaminophen (PERCOCET/ROXICET) 5-325 MG per tablet 1 tablet (Completed)   morphine (PF) 4 MG/ML injection 4 mg (  Completed)   oxyCODONE-acetaminophen (PERCOCET/ROXICET) 5-325 MG tablet                   Final Clinical Impression(s) / ED Diagnoses Final diagnoses:  Kidney stone    Rx / DC Orders ED Discharge Orders          Ordered    oxyCODONE-acetaminophen (PERCOCET/ROXICET) 5-325 MG tablet  Every 6 hours PRN        01/08/23 0604    tamsulosin (FLOMAX) 0.4 MG CAPS capsule  Daily        01/08/23 0604    ondansetron (ZOFRAN-ODT) 4 MG disintegrating tablet  Every 8 hours PRN        01/08/23 0604              Shon Baton, MD 01/08/23 [phone]

## 2023-01-08 NOTE — Discharge Instructions (Addendum)
You were seen today and found to have a kidney stone.  Make sure that you are staying hydrated.  Medications as prescribed.  Follow-up with urology as needed.

## 2023-01-08 NOTE — ED Notes (Signed)
Patient transported to CT 

## 2023-01-08 NOTE — ED Triage Notes (Signed)
Pt to ed from home c/o left flank pain that started 2230 last night. Pt has had blood in urine since yesterday. Pt has hx of kidney stone and states feels the same.

## 2023-01-17 ENCOUNTER — Encounter (INDEPENDENT_AMBULATORY_CARE_PROVIDER_SITE_OTHER): Payer: Self-pay

## 2023-03-04 ENCOUNTER — Emergency Department (HOSPITAL_COMMUNITY): Payer: Medicaid Other

## 2023-03-04 ENCOUNTER — Inpatient Hospital Stay (HOSPITAL_COMMUNITY)
Admission: EM | Admit: 2023-03-04 | Discharge: 2023-03-07 | DRG: 193 | Disposition: A | Discharge status: Home / Self Care | Payer: Medicaid Other | Attending: Family Medicine | Admitting: Family Medicine

## 2023-03-04 ENCOUNTER — Other Ambulatory Visit: Payer: Self-pay

## 2023-03-04 ENCOUNTER — Encounter (HOSPITAL_COMMUNITY): Payer: Self-pay | Admitting: Radiology

## 2023-03-04 ENCOUNTER — Ambulatory Visit
Admission: EM | Admit: 2023-03-04 | Discharge: 2023-03-04 | Disposition: A | Discharge status: Home / Self Care | Payer: Medicaid Other

## 2023-03-04 DIAGNOSIS — J157 Pneumonia due to Mycoplasma pneumoniae: Principal | ICD-10-CM | POA: Diagnosis present

## 2023-03-04 DIAGNOSIS — Z1152 Encounter for screening for COVID-19: Secondary | ICD-10-CM

## 2023-03-04 DIAGNOSIS — J9601 Acute respiratory failure with hypoxia: Secondary | ICD-10-CM | POA: Diagnosis not present

## 2023-03-04 DIAGNOSIS — R531 Weakness: Secondary | ICD-10-CM

## 2023-03-04 DIAGNOSIS — Z87442 Personal history of urinary calculi: Secondary | ICD-10-CM

## 2023-03-04 DIAGNOSIS — F603 Borderline personality disorder: Secondary | ICD-10-CM | POA: Diagnosis present

## 2023-03-04 DIAGNOSIS — T380X5A Adverse effect of glucocorticoids and synthetic analogues, initial encounter: Secondary | ICD-10-CM | POA: Diagnosis not present

## 2023-03-04 DIAGNOSIS — F32A Depression, unspecified: Secondary | ICD-10-CM | POA: Diagnosis present

## 2023-03-04 DIAGNOSIS — R0602 Shortness of breath: Secondary | ICD-10-CM

## 2023-03-04 DIAGNOSIS — Z79899 Other long term (current) drug therapy: Secondary | ICD-10-CM

## 2023-03-04 DIAGNOSIS — F17210 Nicotine dependence, cigarettes, uncomplicated: Secondary | ICD-10-CM | POA: Diagnosis present

## 2023-03-04 DIAGNOSIS — F17290 Nicotine dependence, other tobacco product, uncomplicated: Secondary | ICD-10-CM | POA: Diagnosis present

## 2023-03-04 DIAGNOSIS — R918 Other nonspecific abnormal finding of lung field: Secondary | ICD-10-CM

## 2023-03-04 DIAGNOSIS — R000 Tachycardia, unspecified: Secondary | ICD-10-CM

## 2023-03-04 DIAGNOSIS — Z634 Disappearance and death of family member: Secondary | ICD-10-CM

## 2023-03-04 DIAGNOSIS — Z91041 Radiographic dye allergy status: Secondary | ICD-10-CM

## 2023-03-04 DIAGNOSIS — Z818 Family history of other mental and behavioral disorders: Secondary | ICD-10-CM

## 2023-03-04 DIAGNOSIS — U070 Vaping-related disorder: Secondary | ICD-10-CM | POA: Diagnosis not present

## 2023-03-04 DIAGNOSIS — R051 Acute cough: Secondary | ICD-10-CM | POA: Diagnosis not present

## 2023-03-04 DIAGNOSIS — F419 Anxiety disorder, unspecified: Secondary | ICD-10-CM | POA: Diagnosis present

## 2023-03-04 DIAGNOSIS — F4321 Adjustment disorder with depressed mood: Secondary | ICD-10-CM | POA: Diagnosis not present

## 2023-03-04 DIAGNOSIS — Z6837 Body mass index (BMI) 37.0-37.9, adult: Secondary | ICD-10-CM

## 2023-03-04 DIAGNOSIS — Z8249 Family history of ischemic heart disease and other diseases of the circulatory system: Secondary | ICD-10-CM

## 2023-03-04 DIAGNOSIS — E66812 Obesity, class 2: Secondary | ICD-10-CM | POA: Diagnosis present

## 2023-03-04 DIAGNOSIS — R053 Chronic cough: Secondary | ICD-10-CM | POA: Diagnosis present

## 2023-03-04 DIAGNOSIS — D72823 Leukemoid reaction: Secondary | ICD-10-CM | POA: Diagnosis not present

## 2023-03-04 DIAGNOSIS — F4320 Adjustment disorder, unspecified: Secondary | ICD-10-CM | POA: Diagnosis present

## 2023-03-04 LAB — RESP PANEL BY RT-PCR (RSV, FLU A&B, COVID)  RVPGX2
Influenza A by PCR: NEGATIVE
Influenza B by PCR: NEGATIVE
Resp Syncytial Virus by PCR: NEGATIVE
SARS Coronavirus 2 by RT PCR: NEGATIVE

## 2023-03-04 LAB — CBC WITH DIFFERENTIAL/PLATELET
Abs Immature Granulocytes: 0.13 10*3/uL — ABNORMAL HIGH (ref 0.00–0.07)
Basophils Absolute: 0 10*3/uL (ref 0.0–0.1)
Basophils Relative: 0 %
Eosinophils Absolute: 0.1 10*3/uL (ref 0.0–0.5)
Eosinophils Relative: 1 %
HCT: 37.8 % (ref 36.0–46.0)
Hemoglobin: 12.3 g/dL (ref 12.0–15.0)
Immature Granulocytes: 1 %
Lymphocytes Relative: 15 %
Lymphs Abs: 1.8 10*3/uL (ref 0.7–4.0)
MCH: 30.3 pg (ref 26.0–34.0)
MCHC: 32.5 g/dL (ref 30.0–36.0)
MCV: 93.1 fL (ref 80.0–100.0)
Monocytes Absolute: 0.5 10*3/uL (ref 0.1–1.0)
Monocytes Relative: 4 %
Neutro Abs: 9.5 10*3/uL — ABNORMAL HIGH (ref 1.7–7.7)
Neutrophils Relative %: 79 %
Platelets: 482 10*3/uL — ABNORMAL HIGH (ref 150–400)
RBC: 4.06 MIL/uL (ref 3.87–5.11)
RDW: 14 % (ref 11.5–15.5)
WBC: 12 10*3/uL — ABNORMAL HIGH (ref 4.0–10.5)
nRBC: 0 % (ref 0.0–0.2)

## 2023-03-04 LAB — COMPREHENSIVE METABOLIC PANEL
ALT: 28 U/L (ref 0–44)
AST: 17 U/L (ref 15–41)
Albumin: 3.2 g/dL — ABNORMAL LOW (ref 3.5–5.0)
Alkaline Phosphatase: 91 U/L (ref 38–126)
Anion gap: 11 (ref 5–15)
BUN: 6 mg/dL (ref 6–20)
CO2: 26 mmol/L (ref 22–32)
Calcium: 8.8 mg/dL — ABNORMAL LOW (ref 8.9–10.3)
Chloride: 99 mmol/L (ref 98–111)
Creatinine, Ser: 0.59 mg/dL (ref 0.44–1.00)
GFR, Estimated: >60 mL/min (ref >60)
Glucose, Bld: 108 mg/dL — ABNORMAL HIGH (ref 70–99)
Potassium: 4.1 mmol/L (ref 3.5–5.1)
Sodium: 136 mmol/L (ref 135–145)
Total Bilirubin: 0.2 mg/dL — ABNORMAL LOW (ref 0.3–1.2)
Total Protein: 7.6 g/dL (ref 6.5–8.1)

## 2023-03-04 LAB — D-DIMER, QUANTITATIVE: D-Dimer, Quant: 0.47 ug{FEU}/mL (ref 0.00–0.50)

## 2023-03-04 LAB — LACTIC ACID, PLASMA
Lactic Acid, Venous: 1.2 mmol/L (ref 0.5–1.9)
Lactic Acid, Venous: 1.2 mmol/L (ref 0.5–1.9)

## 2023-03-04 LAB — BLOOD GAS, VENOUS
Acid-Base Excess: 0 mmol/L (ref 0.0–2.0)
Bicarbonate: 23.8 mmol/L (ref 20.0–28.0)
Drawn by: 1528
O2 Saturation: 94.9 %
Patient temperature: 36.7
pCO2, Ven: 34 mm[Hg] — ABNORMAL LOW (ref 44–60)
pH, Ven: 7.44 — ABNORMAL HIGH (ref 7.25–7.43)
pO2, Ven: 63 mm[Hg] — ABNORMAL HIGH (ref 32–45)

## 2023-03-04 LAB — HCG, SERUM, QUALITATIVE: Preg, Serum: NEGATIVE

## 2023-03-04 MED ORDER — ACETAMINOPHEN 650 MG RE SUPP
650.0000 mg | Freq: Four times a day (QID) | RECTAL | Status: DC | PRN
Start: 2023-03-04 — End: 2023-03-06

## 2023-03-04 MED ORDER — SODIUM CHLORIDE 0.9 % IV SOLN: 500.0000 mg | INTRAVENOUS | Status: DC

## 2023-03-04 MED ORDER — METHYLPREDNISOLONE SODIUM SUCC 125 MG IJ SOLR
80.0000 mg | Freq: Three times a day (TID) | INTRAMUSCULAR | Status: DC
  Administered 2023-03-05: 80 mg via INTRAVENOUS
  Filled 2023-03-04: qty 2

## 2023-03-04 MED ORDER — METHYLPREDNISOLONE SODIUM SUCC 125 MG IJ SOLR
125.0000 mg | Freq: Once | INTRAMUSCULAR | Status: AC
  Administered 2023-03-04: 125 mg via INTRAVENOUS
  Filled 2023-03-04: qty 2

## 2023-03-04 MED ORDER — IPRATROPIUM-ALBUTEROL 0.5-2.5 (3) MG/3ML IN SOLN
3.0000 mL | Freq: Once | RESPIRATORY_TRACT | Status: AC
  Administered 2023-03-04: 3 mL via RESPIRATORY_TRACT
  Filled 2023-03-04: qty 3

## 2023-03-04 MED ORDER — PANTOPRAZOLE SODIUM 40 MG PO TBEC
40.0000 mg | DELAYED_RELEASE_TABLET | Freq: Every day | ORAL | Status: DC
  Administered 2023-03-05 – 2023-03-07 (×3): 40 mg via ORAL
  Filled 2023-03-04 (×3): qty 1

## 2023-03-04 MED ORDER — ACETAMINOPHEN 325 MG PO TABS
650.0000 mg | ORAL_TABLET | Freq: Four times a day (QID) | ORAL | Status: DC | PRN
Start: 2023-03-04 — End: 2023-03-06
  Administered 2023-03-05: 650 mg via ORAL
  Filled 2023-03-04: qty 2

## 2023-03-04 MED ORDER — SODIUM CHLORIDE 0.9 % IV SOLN
1.0000 g | Freq: Once | INTRAVENOUS | Status: AC
  Administered 2023-03-04: 1 g via INTRAVENOUS
  Filled 2023-03-04: qty 10

## 2023-03-04 MED ORDER — CLONAZEPAM 0.5 MG PO TABS
0.5000 mg | ORAL_TABLET | Freq: Every day | ORAL | Status: DC
  Administered 2023-03-05 – 2023-03-06 (×2): 0.5 mg via ORAL
  Filled 2023-03-04 (×2): qty 1

## 2023-03-04 MED ORDER — POLYETHYLENE GLYCOL 3350 17 G PO PACK: 17.0000 g | PACK | Freq: Every day | ORAL | Status: DC | PRN

## 2023-03-04 MED ORDER — ENOXAPARIN SODIUM 40 MG/0.4ML IJ SOSY
40.0000 mg | PREFILLED_SYRINGE | INTRAMUSCULAR | Status: DC
Start: 2023-03-04 — End: 2023-03-07
  Administered 2023-03-04 – 2023-03-06 (×3): 40 mg via SUBCUTANEOUS
  Filled 2023-03-04 (×3): qty 0.4

## 2023-03-04 MED ORDER — KETOROLAC TROMETHAMINE 15 MG/ML IJ SOLN
15.0000 mg | Freq: Once | INTRAMUSCULAR | Status: AC
  Administered 2023-03-04: 15 mg via INTRAVENOUS
  Filled 2023-03-04: qty 1

## 2023-03-04 MED ORDER — IPRATROPIUM-ALBUTEROL 0.5-2.5 (3) MG/3ML IN SOLN
3.0000 mL | RESPIRATORY_TRACT | Status: DC
  Administered 2023-03-04 – 2023-03-06 (×11): 3 mL via RESPIRATORY_TRACT
  Filled 2023-03-04 (×11): qty 3

## 2023-03-04 MED ORDER — SODIUM CHLORIDE 0.9 % IV SOLN
2.0000 g | INTRAVENOUS | Status: DC
  Administered 2023-03-05 – 2023-03-06 (×2): 2 g via INTRAVENOUS
  Filled 2023-03-04 (×2): qty 20

## 2023-03-04 MED ORDER — SODIUM CHLORIDE 0.9 % IV SOLN
500.0000 mg | INTRAVENOUS | Status: DC
  Administered 2023-03-04 – 2023-03-06 (×3): 500 mg via INTRAVENOUS
  Filled 2023-03-04 (×3): qty 5

## 2023-03-04 MED ORDER — HYDRALAZINE HCL 20 MG/ML IJ SOLN: 5.0000 mg | INTRAMUSCULAR | Status: DC | PRN

## 2023-03-04 MED ORDER — ALBUTEROL SULFATE (2.5 MG/3ML) 0.083% IN NEBU: 2.5000 mg | INHALATION_SOLUTION | RESPIRATORY_TRACT | Status: DC | PRN

## 2023-03-04 NOTE — ED Notes (Signed)
Pt place on O2 2 liters [place].  Sat up to 91%. Rachael PA made aware of pt and is at bedside evaluating pt.

## 2023-03-04 NOTE — ED Notes (Signed)
Patient is being discharged from the Urgent Care and sent to the Emergency Department via private vehicle . Per PA, patient is in need of higher level of care due to low O2 and SOB. Patient is aware and verbalizes understanding of plan of care.  Vitals:   03/04/23 1348  BP: 120/82  Pulse: (!) 110  Resp: 20  Temp: 98.2 F (36.8 C)  SpO2: (!) 89%

## 2023-03-04 NOTE — ED Notes (Signed)
ED TO INPATIENT HANDOFF REPORT  ED Nurse Name and Phone #: Jess F   S Name/Age/Gender Darlene Mcbride 32 y.o. female Room/Bed: APA10/APA10  Code Status   Code Status: Full Code  Home/SNF/Other Home Patient oriented to: self, place, time, and situation Is this baseline? Yes   Triage Complete: Triage complete  Chief Complaint Acute respiratory failure with hypoxia (HCC) [J96.01]  Triage Note Pt from urgent care with complaints of shortness of breath and cough x1 week. Pt unable to speak in full sentences. Pt endorses use of vape. Pt oxygen level on arrival is 88% on RA. PT place on 2L [place].    Allergies Allergies  Allergen Reactions   Contrast Media [Iodinated Contrast Media] Hives    Level of Care/Admitting Diagnosis ED Disposition     ED Disposition  Admit   Condition  --   Comment  Hospital Area: [facility] [[id number]]  Level of Care: Telemetry [5]  Covid Evaluation: Confirmed COVID Negative  Diagnosis: Acute respiratory failure with hypoxia [facility]) [[id number]]  Admitting Physician: Chiquita Loth  Attending Physician: Randol Kern, DAWOOD S [[id number]]          B Medical/Surgery History Past Medical History:  Diagnosis Date   Anxiety    Borderline personality disorder (HCC)    Depression    Kidney stones    Mental disorder    Vaginal Pap smear, abnormal    Past Surgical History:  Procedure Laterality Date   APPENDECTOMY     LEEP     OVARIAN CYST REMOVAL     TUBAL LIGATION       A IV Location/Drains/Wounds Patient Lines/Drains/Airways Status     Active Line/Drains/Airways     Name Placement date Placement time Site Days   Peripheral IV 03/04/23 22 G Left Antecubital 03/04/23  1545  Antecubital  less than 1            Intake/Output Last 24 hours No intake or output data in the 24 hours ending 03/04/23 2158  Labs/Imaging Results for orders placed or performed during the hospital encounter of 03/04/23 (from the past 48 hour(s))   Resp panel by RT-PCR (RSV, Flu A&B, Covid) Anterior Nasal Swab     Status: None   Collection Time: 03/04/23  2:35 PM   Specimen: Anterior Nasal Swab  Result Value Ref Range   SARS Coronavirus 2 by RT PCR NEGATIVE NEGATIVE    Comment: (NOTE) SARS-CoV-2 target nucleic acids are NOT DETECTED.  The SARS-CoV-2 RNA is generally detectable in upper respiratory specimens during the acute phase of infection. The lowest concentration of SARS-CoV-2 viral copies this assay can detect is 138 copies/mL. A negative result does not preclude SARS-Cov-2 infection and should not be used as the sole basis for treatment or other patient management decisions. A negative result may occur with  improper specimen collection/handling, submission of specimen other than nasopharyngeal swab, presence of viral mutation(s) within the areas targeted by this assay, and inadequate number of viral copies(<138 copies/mL). A negative result must be combined with clinical observations, patient history, and epidemiological information. The expected result is Negative.  Fact Sheet for Patients:  BloggerCourse.com  Fact Sheet for Healthcare Providers:  SeriousBroker.it  This test is no t yet approved or cleared by the [place] FDA and  has been authorized for detection and/or diagnosis of SARS-CoV-2 by FDA under an Emergency Use Authorization (EUA). This EUA will remain  in effect (meaning this test can be used) for the duration of  the COVID-19 declaration under Section 564(b)(1) of the Act, 21 U.S.C.section 360bbb-3(b)(1), unless the authorization is terminated  or revoked sooner.       Influenza A by PCR NEGATIVE NEGATIVE   Influenza B by PCR NEGATIVE NEGATIVE    Comment: (NOTE) The Xpert Xpress SARS-CoV-2/FLU/RSV plus assay is intended as an aid in the diagnosis of influenza from Nasopharyngeal swab specimens and should not be used as a sole basis for  treatment. Nasal washings and aspirates are unacceptable for Xpert Xpress SARS-CoV-2/FLU/RSV testing.  Fact Sheet for Patients: BloggerCourse.com  Fact Sheet for Healthcare Providers: SeriousBroker.it  This test is not yet approved or cleared by the [place] FDA and has been authorized for detection and/or diagnosis of SARS-CoV-2 by FDA under an Emergency Use Authorization (EUA). This EUA will remain in effect (meaning this test can be used) for the duration of the COVID-19 declaration under Section 564(b)(1) of the Act, 21 U.S.C. section 360bbb-3(b)(1), unless the authorization is terminated or revoked.     Resp Syncytial Virus by PCR NEGATIVE NEGATIVE    Comment: (NOTE) Fact Sheet for Patients: BloggerCourse.com  Fact Sheet for Healthcare Providers: SeriousBroker.it  This test is not yet approved or cleared by the [place] FDA and has been authorized for detection and/or diagnosis of SARS-CoV-2 by FDA under an Emergency Use Authorization (EUA). This EUA will remain in effect (meaning this test can be used) for the duration of the COVID-19 declaration under Section 564(b)(1) of the Act, 21 U.S.C. section 360bbb-3(b)(1), unless the authorization is terminated or revoked.  Performed at [facility], [address] [place], [place] [address]   CBC with Differential     Status: Abnormal   Collection Time: 03/04/23  2:48 PM  Result Value Ref Range   WBC 12.0 (H) 4.0 - 10.5 K/uL   RBC 4.06 3.87 - 5.11 MIL/uL   Hemoglobin 12.3 12.0 - 15.0 g/dL   HCT 56.2 13.0 - 86.5 %   MCV 93.1 80.0 - 100.0 fL   MCH 30.3 26.0 - 34.0 pg   MCHC 32.5 30.0 - 36.0 g/dL   RDW 78.4 69.6 - 29.5 %   Platelets 482 (H) 150 - 400 K/uL   nRBC 0.0 0.0 - 0.2 %   Neutrophils Relative % 79 %   Neutro Abs 9.5 (H) 1.7 - 7.7 K/uL   Lymphocytes Relative 15 %   Lymphs Abs 1.8 0.7 - 4.0 K/uL    Monocytes Relative 4 %   Monocytes Absolute 0.5 0.1 - 1.0 K/uL   Eosinophils Relative 1 %   Eosinophils Absolute 0.1 0.0 - 0.5 K/uL   Basophils Relative 0 %   Basophils Absolute 0.0 0.0 - 0.1 K/uL   Immature Granulocytes 1 %   Abs Immature Granulocytes 0.13 (H) 0.00 - 0.07 K/uL    Comment: Performed at [facility], [address], [place], [place] [address]  Comprehensive metabolic panel     Status: Abnormal   Collection Time: 03/04/23  2:48 PM  Result Value Ref Range   Sodium 136 135 - 145 mmol/L   Potassium 4.1 3.5 - 5.1 mmol/L   Chloride 99 98 - 111 mmol/L   CO2 26 22 - 32 mmol/L   Glucose, Bld 108 (H) 70 - 99 mg/dL    Comment: Glucose reference range applies only to samples taken after fasting for at least 8 hours.   BUN 6 6 - 20 mg/dL   Creatinine, Ser 2.44 0.44 - 1.00 mg/dL   Calcium 8.8 (L)  8.9 - 10.3 mg/dL   Total Protein 7.6 6.5 - 8.1 g/dL   Albumin 3.2 (L) 3.5 - 5.0 g/dL   AST 17 15 - 41 U/L   ALT 28 0 - 44 U/L   Alkaline Phosphatase 91 38 - 126 U/L   Total Bilirubin 0.2 (L) 0.3 - 1.2 mg/dL   GFR, Estimated >11 >91 mL/min    Comment: (NOTE) Calculated using the CKD-EPI Creatinine Equation (2021)    Anion gap 11 5 - 15    Comment: Performed at [facility], [address] [place], [place] [address]  hCG, serum, qualitative     Status: None   Collection Time: 03/04/23  4:28 PM  Result Value Ref Range   Preg, Serum NEGATIVE NEGATIVE    Comment:        THE SENSITIVITY OF THIS METHODOLOGY IS >10 mIU/mL. Performed at [facility], [address], [place], [place] [address]   D-dimer, quantitative     Status: None   Collection Time: 03/04/23  4:28 PM  Result Value Ref Range   D-Dimer, Quant 0.47 0.00 - 0.50 ug/mL-FEU    Comment: (NOTE) At the manufacturer cut-off value of 0.5 g/mL FEU, this assay has a negative predictive value of 95-100%.This assay is intended for use in conjunction with a clinical pretest probability (PTP) assessment model to exclude pulmonary  embolism (PE) and deep venous thrombosis (DVT) in outpatients suspected of PE or DVT. Results should be correlated with clinical presentation. Performed at [facility], [address], [place], [place] [address]   Blood culture (routine x 2)     Status: None (Preliminary result)   Collection Time: 03/04/23  4:28 PM   Specimen: Right Antecubital; Blood  Result Value Ref Range   Specimen Description RIGHT ANTECUBITAL    Special Requests      BOTTLES DRAWN AEROBIC AND ANAEROBIC Blood Culture results may not be optimal due to an excessive volume of blood received in culture bottles Performed at [facility], [address] [place], [place] [address]    Culture PENDING    Report Status PENDING   Lactic acid, plasma     Status: None   Collection Time: 03/04/23  4:28 PM  Result Value Ref Range   Lactic Acid, Venous 1.2 0.5 - 1.9 mmol/L    Comment: Performed at [facility], [address] [place], [place] [address]  Lactic acid, plasma     Status: None   Collection Time: 03/04/23  5:02 PM  Result Value Ref Range   Lactic Acid, Venous 1.2 0.5 - 1.9 mmol/L    Comment: Performed at [facility], [address], [place], [place] [address]  Blood gas, venous (at [facility])     Status: Abnormal   Collection Time: 03/04/23  9:38 PM  Result Value Ref Range   pH, Ven 7.44 (H) 7.25 - 7.43   pCO2, Ven 34 (L) 44 - 60 mmHg   pO2, Ven 63 (H) 32 - 45 mmHg   Bicarbonate 23.8 20.0 - 28.0 mmol/L   Acid-Base Excess 0.0 0.0 - 2.0 mmol/L   O2 Saturation 94.9 %   Patient temperature 36.7    Collection site BLOOD RIGHT HAND    Drawn by 1528     Comment: Performed at [facility], [address], [place], [place] [address]   CT Chest Wo Contrast  Result Date: 03/04/2023 CLINICAL DATA:  Respiratory illness, nondiagnostic x-ray. Shortness of breath and cough. EXAM: CT CHEST WITHOUT CONTRAST TECHNIQUE: Multidetector  CT imaging of the chest was performed following the standard protocol without IV contrast.  RADIATION DOSE REDUCTION: This exam was performed according to the departmental dose-optimization program which includes automated exposure control, adjustment of the mA and/or kV according to patient size and/or use of iterative reconstruction technique. COMPARISON:  None Available. FINDINGS: Cardiovascular: The heart is normal in size and there is no pericardial effusion. The aorta and pulmonary trunk are normal in caliber. Mediastinum/Nodes: Enlarged lymph nodes are present in the mediastinum measuring up to 1.5 cm. No axillary lymphadenopathy. Evaluation of the hila is limited due to lack of IV contrast. The thyroid gland, trachea, and esophagus are within normal limits. Lungs/Pleura: Centrilobular nodules are present throughout the lungs bilaterally, greater on the left than on the right. No effusion or pneumothorax. 7 mm nodule is present in the left lower lobe, axial image 100. 5 mm nodule is present in the right upper lobe, axial image 75. Mild atelectasis or scarring is noted in the right middle lobe. Upper Abdomen: No acute abnormality. Musculoskeletal: Degenerative changes are present in the thoracic spine. No acute osseous abnormality. IMPRESSION: 1. Multiple centrilobular nodules in the lungs bilaterally, likely infectious or inflammatory. 2. Bilateral pulmonary nodules measuring up to 7 mm. Non-contrast chest CT at 3-6 months is recommended. If the nodules are stable at time of repeat CT, then future CT at 18-24 months (from today's scan) is considered optional for low-risk patients, but is recommended for high-risk patients. This recommendation follows the consensus statement: Guidelines for Management of Incidental Pulmonary Nodules Detected on CT Images: From the Fleischner Society 2017; Radiology 2017; 284:228-243. 3. Enlarged mediastinal lymph nodes, likely reactive. Electronically Signed   By: Thornell Sartorius M.D.   On: 03/04/2023 20:16   DG Chest 2 View  Result Date: 03/04/2023 CLINICAL DATA:   Short of breath and cough, hypoxia EXAM: CHEST - 2 VIEW COMPARISON:  Prior chest x-ray 05/26/2018 FINDINGS: Cardiac and mediastinal contours are within normal limits. Multifocal mild patchy airspace opacities predominantly in the bilateral lower lobes. Fine interstitial prominence. No pleural effusion or pneumothorax. Osseous structures are intact and unremarkable. IMPRESSION: 1. Bilateral lower lobe bronchial wall thickening and reticulonodular airspace opacities. Findings are nonspecific and may represent inhalational lung disease versus acute bronchitis. Electronically Signed   By: Malachy Moan M.D.   On: 03/04/2023 16:53   US Venous Img Lower Bilateral  Result Date: 03/04/2023 CLINICAL DATA:  32 year old female with a history of hypoxia, lower extremity edema EXAM: BILATERAL LOWER EXTREMITY VENOUS DOPPLER ULTRASOUND TECHNIQUE: Gray-scale sonography with graded compression, as well as color Doppler and duplex ultrasound were performed to evaluate the lower extremity deep venous systems from the level of the common femoral vein and including the common femoral, femoral, profunda femoral, popliteal and calf veins including the posterior tibial, peroneal and gastrocnemius veins when visible. The superficial great saphenous vein was also interrogated. Spectral Doppler was utilized to evaluate flow at rest and with distal augmentation maneuvers in the common femoral, femoral and popliteal veins. COMPARISON:  None Available. FINDINGS: RIGHT LOWER EXTREMITY Common Femoral Vein: No evidence of thrombus. Normal compressibility, respiratory phasicity and response to augmentation. Saphenofemoral Junction: No evidence of thrombus. Normal compressibility and flow on color Doppler imaging. Profunda Femoral Vein: No evidence of thrombus. Normal compressibility and flow on color Doppler imaging. Femoral Vein: No evidence of thrombus. Normal compressibility, respiratory phasicity and response to augmentation. Popliteal  Vein: No evidence of thrombus. Normal compressibility, respiratory phasicity and response to augmentation. Calf Veins: No  evidence of thrombus. Normal compressibility and flow on color Doppler imaging. Superficial Great Saphenous Vein: No evidence of thrombus. Normal compressibility and flow on color Doppler imaging. Other Findings:  None. LEFT LOWER EXTREMITY Common Femoral Vein: No evidence of thrombus. Normal compressibility, respiratory phasicity and response to augmentation. Saphenofemoral Junction: No evidence of thrombus. Normal compressibility and flow on color Doppler imaging. Profunda Femoral Vein: No evidence of thrombus. Normal compressibility and flow on color Doppler imaging. Femoral Vein: No evidence of thrombus. Normal compressibility, respiratory phasicity and response to augmentation. Popliteal Vein: No evidence of thrombus. Normal compressibility, respiratory phasicity and response to augmentation. Calf Veins: No evidence of thrombus. Normal compressibility and flow on color Doppler imaging. Superficial Great Saphenous Vein: No evidence of thrombus. Normal compressibility and flow on color Doppler imaging. Other Findings:  None. IMPRESSION: Directed duplex of the bilateral lower extremity negative for DVT Signed, Yvone Neu. Miachel Roux, RPVI [facility] Radiology Electronically Signed   By: Gilmer Mor D.O.   On: 03/04/2023 16:13    Pending Labs Unresulted Labs (From admission, onward)     Start     Ordered   03/05/23 0500  HIV Antibody (routine testing w rflx)  (HIV Antibody (Routine testing w reflex) panel)  Tomorrow morning,   R        03/04/23 2127   03/05/23 0500  Angiotensin converting enzyme  Once,   R        03/05/23 0500   03/05/23 0500  Basic metabolic panel  Once,   R        03/05/23 0500   03/05/23 0500  CBC  Once,   R        03/05/23 0500   03/04/23 2128  Legionella Pneumophila Serogp 1 Ur Ag  (COPD / Pneumonia /  Cellulitis / Lower Extremity Wound)  Once,   R        03/04/23 2127   03/04/23 2128  Strep pneumoniae urinary antigen  (COPD / Pneumonia / Cellulitis / Lower Extremity Wound)  Once,   R        03/04/23 2127   03/04/23 2102  Respiratory (~20 pathogens) panel by PCR  (Respiratory panel by PCR (~20 pathogens, ~24 hr TAT)  w precautions)  Once,   R        03/04/23 2101   03/04/23 2101  Rapid urine drug screen (hospital performed)  ONCE - STAT,   STAT        03/04/23 2101   03/04/23 1510  Blood culture (routine x 2)  BLOOD CULTURE X 2,   R (with STAT occurrences)      03/04/23 1520            Vitals/Pain Today's Vitals   03/04/23 1815 03/04/23 1830 03/04/23 2020 03/04/23 2144  BP: 137/86 127/69    Pulse: 85 86    Resp: (!) 34 (!) 29    Temp:  98 F (36.7 C)    TempSrc:  Oral    SpO2: 93% 92%  90%  Weight:      Height:      PainSc:  0-No pain 0-No pain     Isolation Precautions Droplet precaution  Medications Medications  cefTRIAXone (ROCEPHIN) 1 g in sodium chloride 0.9 % 100 mL IVPB (1 g Intravenous New Bag/Given 03/04/23 2135)  azithromycin (ZITHROMAX) 500 mg in sodium chloride 0.9 % 250 mL IVPB (500 mg Intravenous New Bag/Given 03/04/23 2136)  clonazePAM (KLONOPIN) tablet 0.5 mg (  has no administration in time range)  enoxaparin (LOVENOX) injection 40 mg (has no administration in time range)  cefTRIAXone (ROCEPHIN) 2 g in sodium chloride 0.9 % 100 mL IVPB (has no administration in time range)  acetaminophen (TYLENOL) tablet 650 mg (has no administration in time range)    Or  acetaminophen (TYLENOL) suppository 650 mg (has no administration in time range)  polyethylene glycol (MIRALAX / GLYCOLAX) packet 17 g (has no administration in time range)  albuterol (PROVENTIL) (2.5 MG/3ML) 0.083% nebulizer solution 2.5 mg (has no administration in time range)  hydrALAZINE (APRESOLINE) injection 5 mg (has no administration in time range)  ipratropium-albuterol (DUONEB) 0.5-2.5 (3)  MG/3ML nebulizer solution 3 mL (3 mLs Nebulization Given 03/04/23 2144)  methylPREDNISolone sodium succinate (SOLU-MEDROL) 125 mg/2 mL injection 80 mg (has no administration in time range)  pantoprazole (PROTONIX) EC tablet 40 mg (has no administration in time range)  ipratropium-albuterol (DUONEB) 0.5-2.5 (3) MG/3ML nebulizer solution 3 mL (3 mLs Nebulization Given 03/04/23 1632)  methylPREDNISolone sodium succinate (SOLU-MEDROL) 125 mg/2 mL injection 125 mg (125 mg Intravenous Given 03/04/23 1600)  ketorolac (TORADOL) 15 MG/ML injection 15 mg (15 mg Intravenous Given 03/04/23 1559)    Mobility walks     Focused Assessments Pulmonary Assessment Handoff:  Lung sounds: Bilateral Breath Sounds: Clear, Diminished L Breath Sounds: Rhonchi O2 Device: Nasal Cannula O2 Flow Rate (L/min): 3 L/min    R Recommendations: See Admitting Provider Note  Report given to:   Additional Notes: n/a

## 2023-03-04 NOTE — ED Notes (Signed)
Pt states she is feeling better with O2. Currently at 3 L/min via n/c. Pt is requesting something to eat. EDP notified.

## 2023-03-04 NOTE — ED Triage Notes (Signed)
Pt states cough and SOB for the past week.  States she has taken 4 at home Covid tests during the week that were all negative.

## 2023-03-04 NOTE — ED Triage Notes (Signed)
Pt from urgent care with complaints of shortness of breath and cough x1 week. Pt unable to speak in full sentences. Pt endorses use of vape. Pt oxygen level on arrival is 88% on RA. PT place on 2L [place].

## 2023-03-04 NOTE — ED Provider Notes (Signed)
[facility]    CSN: [id number] Arrival date & time: 03/04/23  1330      History   Chief Complaint Chief Complaint  Patient presents with   Cough    HPI Darlene Mcbride is a 32 y.o. female.   Presenting today with 1 week history of cough, shortness of breath.  States symptoms significantly worsened 3 days ago and have progressively worsened since.  She now has severe shortness of breath, unable to speak in full sentences and feeling very weak and fatigued.  Denies fever, chills, body aches, sore throat, nasal congestion associated.  Denies any history of chronic pulmonary disease including asthma or COPD, does have an albuterol inhaler at home that she has been using quite frequently with no improvement.  Otherwise taking over-the-counter cold and congestion medication with no relief.    Past Medical History:  Diagnosis Date   Anxiety    Borderline personality disorder (HCC)    Depression    Kidney stones    Mental disorder    Vaginal Pap smear, abnormal     Patient Active Problem List   Diagnosis Date Noted   Menorrhagia with irregular cycle 09/28/2017   Dysmenorrhea 09/28/2017   Nipple discharge 09/28/2017   History of abnormal cervical Pap smear 09/28/2017   Encounter for gynecological examination with Papanicolaou smear of cervix 09/28/2017   Depression, recurrent (HCC) 08/21/2017   Fatigue 08/21/2017   History of loop electrosurgical excision procedure (LEEP) 08/21/2017    Past Surgical History:  Procedure Laterality Date   APPENDECTOMY     LEEP     OVARIAN CYST REMOVAL     TUBAL LIGATION      OB History     Gravida  3   Para  3   Term      Preterm      AB      Living  3      SAB      IAB      Ectopic      Multiple      Live Births               Home Medications    Prior to Admission medications   Medication Sig Start Date End Date Taking? Authorizing Provider  ALPRAZolam (XANAX) 0.25 MG tablet Take 0.25 mg by  mouth daily as needed. 03/27/20   [provider]  baclofen (LIORESAL) 10 MG tablet Take 5 mg by mouth 2 (two) times daily.    [provider]  buPROPion (WELLBUTRIN) 100 MG tablet Take 100 mg by mouth daily.    [provider]  clonazePAM (KLONOPIN) 0.5 MG tablet Take 0.5 mg by mouth daily.    [provider]  escitalopram (LEXAPRO) 10 MG tablet Take 10 mg by mouth daily. 03/27/20   [provider]  ibuprofen (ADVIL) 600 MG tablet Take 1 tablet (600 mg total) by mouth every 6 (six) hours as needed. 02/23/19   Eber Hong, MD  methocarbamol (ROBAXIN) 500 MG tablet Take 1 tablet (500 mg total) by mouth 2 (two) times daily. 08/13/22   Leath-Warren, Sadie Haber, NP  metroNIDAZOLE (FLAGYL) 500 MG tablet Take 1 tablet (500 mg total) by mouth 2 (two) times daily. 03/29/20   Walisiewicz, Yvonna Alanis E, PA-C  naproxen (NAPROSYN) 250 MG tablet Take 250 mg by mouth 2 (two) times daily with a meal.    [provider]  ondansetron (ZOFRAN-ODT) 4 MG disintegrating tablet Take 1 tablet (4 mg total)  by mouth every 8 (eight) hours as needed. 01/08/23   Horton, Mayer Masker, MD  oxyCODONE-acetaminophen (PERCOCET/ROXICET) 5-325 MG tablet Take 1 tablet by mouth every 6 (six) hours as needed for severe pain. 01/08/23   Horton, Mayer Masker, MD  tamsulosin (FLOMAX) 0.4 MG CAPS capsule Take 1 capsule (0.4 mg total) by mouth daily. 01/08/23   Horton, Mayer Masker, MD    Family History Family History  Problem Relation Age of Onset   Diabetes Mother    Depression Mother    Hypertension Father    Depression Father    Post-traumatic stress disorder Father    Breast cancer Maternal Grandmother    Breast cancer Paternal Grandmother    Brain cancer Maternal Grandfather    Kidney cancer Maternal Grandfather    Colon cancer Paternal Grandfather     Social History Social History   Tobacco Use   Smoking status: Former    Current packs/day: 0.50    Types: Cigarettes    Smokeless tobacco: Never   Tobacco comments:    wants to discuss options   Vaping Use   Vaping status: Every Day  Substance Use Topics   Alcohol use: No   Drug use: No     Allergies   Contrast media [iodinated contrast media]   Review of Systems Review of Systems PER HPI  Physical Exam Triage Vital Signs ED Triage Vitals [03/04/23 1348]  Encounter Vitals Group     BP 120/82     Systolic BP Percentile      Diastolic BP Percentile      Pulse Rate (!) 110     Resp 20     Temp 98.2 F (36.8 C)     Temp Source Oral     SpO2 (!) 89 %     Weight      Height      Head Circumference      Peak Flow      Pain Score 0     Pain Loc      Pain Education      Exclude from Growth Chart    No data found.  Updated Vital Signs BP 120/82 (BP Location: Right Arm)   Pulse (!) 110   Temp 98.2 F (36.8 C) (Oral)   Resp 20   LMP  (LMP Unknown)   SpO2 (!) 89%   Visual Acuity Right Eye Distance:   Left Eye Distance:   Bilateral Distance:    Right Eye Near:   Left Eye Near:    Bilateral Near:     Physical Exam Vitals and nursing note reviewed.  Constitutional:      General: She is in acute distress.     Appearance: She is ill-appearing.  HENT:     Head: Atraumatic.     Mouth/Throat:     Mouth: Mucous membranes are moist.  Eyes:     Extraocular Movements: Extraocular movements intact.     Conjunctiva/sclera: Conjunctivae normal.  Cardiovascular:     Rate and Rhythm: Tachycardia present.  Pulmonary:     Effort: Respiratory distress present.     Breath sounds: Wheezing and rales present.     Comments: Unable to speak in full sentences, shallow tachypneic breaths Musculoskeletal:        General: Normal range of motion.     Cervical back: Normal range of motion and neck supple.  Skin:    General: Skin is warm and dry.  Neurological:     Mental Status: She is  alert and oriented to person, place, and time.     Motor: No weakness.     Gait: Gait normal.   Psychiatric:        Mood and Affect: Mood normal.        Thought Content: Thought content normal.        Judgment: Judgment normal.      UC Treatments / Results  Labs (all labs ordered are listed, but only abnormal results are displayed) Labs Reviewed - No data to display  EKG   Radiology No results found.  Procedures Procedures (including critical care time)  Medications Ordered in UC Medications - No data to display  Initial Impression / Assessment and Plan / UC Course  I have reviewed the triage vital signs and the nursing notes.  Pertinent labs & imaging results that were available during my care of the patient were reviewed by me and considered in my medical decision making (see chart for details).     Hypoxic and tachycardic in triage, in obvious respiratory distress.  Oxygen was placed at 2 L via nasal cannula with no significant improvement in her oxygen saturations, lung sounds with wheezes and rales bilaterally.  She is already been using her albuterol inhaler with no improvement.  Discussed need for further evaluation and management in the emergency department.  She declines EMS transport today, friend who is with her plans to take her via private vehicle to the closest emergency department.  Final Clinical Impressions(s) / UC Diagnoses   Final diagnoses:  Acute respiratory failure with hypoxia (HCC)  Tachycardia  Weakness  Acute cough  SOB (shortness of breath)     Discharge Instructions      Go directly to the emergency department for further evaluation    ED Prescriptions   None    PDMP not reviewed this encounter.   Particia Nearing, [place] 03/04/23 1409

## 2023-03-04 NOTE — ED Provider Notes (Addendum)
[place] EM[facility] AT [facility] Provider Note   CSN: [id number] Arrival date & time: 03/04/23  1410     History  Chief Complaint  Patient presents with   Shortness of Breath    Darlene Mcbride is a 32 y.o. female.  Patient is a 32 year old female with past medical history of anxiety, borderline personality disorder, depression, and kidney stones presenting for complaints of shortness of breath.  Patient was seen in outpatient urgent care and sent to emergency department for hypoxia.  Patient with shortness of breath x 1 week.  Worse with exertion.  Unable to speak in full sentences.  She admits to some chronic cough that she states is likely due to her previous tobacco use and current vaping.  She denies URI symptoms including no new coughs, fevers, chills, nasal congestion, sore throat, nausea, vomiting, or diarrhea.  She states she has not been pain much attempt to her herself or any other symptoms over the past week since her mother passed away (this month) after anaphylactic reaction from her chemotherapy.  ROS pertinent for bilateral leg cramping, right unilateral leg swelling, tobacco use history.  Denies prior history of DVT/PE.  No blood thinner use.  No orthopnea, history of CHF, or recent weight gain.  The history is provided by the patient. No language interpreter was used.  Shortness of Breath Associated symptoms: no abdominal pain, no chest pain, no cough, no ear pain, no fever, no rash, no sore throat and no vomiting        Home Medications Prior to Admission medications   Medication Sig Start Date End Date Taking? Authorizing Provider  acetaminophen (TYLENOL) 500 MG tablet Take 500 mg by mouth every 6 (six) hours as needed for moderate pain (pain score 4-6).   Yes [provider]  clonazePAM (KLONOPIN) 0.5 MG tablet Take 0.5 mg by mouth daily.   Yes [provider]  ibuprofen (ADVIL) 200 MG tablet Take 200 mg by mouth every 6  (six) hours as needed for mild pain (pain score 1-3).   Yes [provider]      Allergies    Contrast media [iodinated contrast media]    Review of Systems   Review of Systems  Constitutional:  Negative for chills and fever.  HENT:  Negative for ear pain and sore throat.   Eyes:  Negative for pain and visual disturbance.  Respiratory:  Positive for shortness of breath. Negative for cough.   Cardiovascular:  Positive for leg swelling. Negative for chest pain and palpitations.  Gastrointestinal:  Negative for abdominal pain and vomiting.  Genitourinary:  Negative for dysuria and hematuria.  Musculoskeletal:  Negative for arthralgias and back pain.  Skin:  Negative for color change and rash.  Neurological:  Negative for seizures and syncope.  All other systems reviewed and are negative.   Physical Exam Updated Vital Signs BP 127/69 (BP Location: Left Arm)   Pulse 86   Temp 98 F (36.7 C) (Oral)   Resp (!) 29   Ht 5\' 4"  (1.626 m)   Wt 99.8 kg   LMP  (LMP Unknown)   SpO2 92%   BMI 37.76 kg/m  Physical Exam Vitals and nursing note reviewed.  Constitutional:      General: She is not in acute distress.    Appearance: She is well-developed.  HENT:     Head: Normocephalic and atraumatic.  Eyes:     Conjunctiva/sclera: Conjunctivae normal.  Cardiovascular:  Rate and Rhythm: Regular rhythm. Tachycardia present.     Heart sounds: No murmur heard. Pulmonary:     Effort: Tachypnea, accessory muscle usage and respiratory distress present.     Breath sounds: Examination of the right-upper field reveals wheezing. Examination of the left-upper field reveals wheezing. Examination of the right-middle field reveals wheezing. Examination of the left-middle field reveals wheezing. Examination of the right-lower field reveals wheezing. Examination of the left-lower field reveals wheezing. Wheezing (expiratory) present.  Abdominal:     Palpations: Abdomen is soft.      Tenderness: There is no abdominal tenderness.  Musculoskeletal:        General: No swelling.     Cervical back: Neck supple.  Skin:    General: Skin is warm and dry.     Capillary Refill: Capillary refill takes less than 2 seconds.  Neurological:     Mental Status: She is alert.  Psychiatric:        Mood and Affect: Mood normal.     ED Results / Procedures / Treatments   Labs (all labs ordered are listed, but only abnormal results are displayed) Labs Reviewed  CBC WITH DIFFERENTIAL/PLATELET - Abnormal; Notable for the following components:      Result Value   WBC 12.0 (*)    Platelets 482 (*)    Neutro Abs 9.5 (*)    Abs Immature Granulocytes 0.13 (*)    All other components within normal limits  COMPREHENSIVE METABOLIC PANEL - Abnormal; Notable for the following components:   Glucose, Bld 108 (*)    Calcium 8.8 (*)    Albumin 3.2 (*)    Total Bilirubin 0.2 (*)    All other components within normal limits  RESP PANEL BY RT-PCR (RSV, FLU A&B, COVID)  [id number]  CULTURE, BLOOD (ROUTINE X 2)  CULTURE, BLOOD (ROUTINE X 2)  RESPIRATORY PANEL BY PCR  HCG, SERUM, QUALITATIVE  D-DIMER, QUANTITATIVE  LACTIC ACID, PLASMA  LACTIC ACID, PLASMA  BLOOD GAS, VENOUS  RAPID URINE DRUG SCREEN, HOSP PERFORMED    EKG None  Radiology CT Chest Wo Contrast  Result Date: 03/04/2023 CLINICAL DATA:  Respiratory illness, nondiagnostic x-ray. Shortness of breath and cough. EXAM: CT CHEST WITHOUT CONTRAST TECHNIQUE: Multidetector CT imaging of the chest was performed following the standard protocol without IV contrast. RADIATION DOSE REDUCTION: This exam was performed according to the departmental dose-optimization program which includes automated exposure control, adjustment of the mA and/or kV according to patient size and/or use of iterative reconstruction technique. COMPARISON:  None Available. FINDINGS: Cardiovascular: The heart is normal in size and there is no pericardial effusion. The aorta  and pulmonary trunk are normal in caliber. Mediastinum/Nodes: Enlarged lymph nodes are present in the mediastinum measuring up to 1.5 cm. No axillary lymphadenopathy. Evaluation of the hila is limited due to lack of IV contrast. The thyroid gland, trachea, and esophagus are within normal limits. Lungs/Pleura: Centrilobular nodules are present throughout the lungs bilaterally, greater on the left than on the right. No effusion or pneumothorax. 7 mm nodule is present in the left lower lobe, axial image 100. 5 mm nodule is present in the right upper lobe, axial image 75. Mild atelectasis or scarring is noted in the right middle lobe. Upper Abdomen: No acute abnormality. Musculoskeletal: Degenerative changes are present in the thoracic spine. No acute osseous abnormality. IMPRESSION: 1. Multiple centrilobular nodules in the lungs bilaterally, likely infectious or inflammatory. 2. Bilateral pulmonary nodules measuring up to 7 mm. Non-contrast chest CT at  3-6 months is recommended. If the nodules are stable at time of repeat CT, then future CT at 18-24 months (from today's scan) is considered optional for low-risk patients, but is recommended for high-risk patients. This recommendation follows the consensus statement: Guidelines for Management of Incidental Pulmonary Nodules Detected on CT Images: From the Fleischner Society 2017; Radiology 2017; 284:228-243. 3. Enlarged mediastinal lymph nodes, likely reactive. Electronically Signed   By: Thornell Sartorius M.D.   On: 03/04/2023 20:16   DG Chest 2 View  Result Date: 03/04/2023 CLINICAL DATA:  Short of breath and cough, hypoxia EXAM: CHEST - 2 VIEW COMPARISON:  Prior chest x-ray 05/26/2018 FINDINGS: Cardiac and mediastinal contours are within normal limits. Multifocal mild patchy airspace opacities predominantly in the bilateral lower lobes. Fine interstitial prominence. No pleural effusion or pneumothorax. Osseous structures are intact and unremarkable. IMPRESSION: 1.  Bilateral lower lobe bronchial wall thickening and reticulonodular airspace opacities. Findings are nonspecific and may represent inhalational lung disease versus acute bronchitis. Electronically Signed   By: Malachy Moan M.D.   On: 03/04/2023 16:53   US Venous Img Lower Bilateral  Result Date: 03/04/2023 CLINICAL DATA:  32 year old female with a history of hypoxia, lower extremity edema EXAM: BILATERAL LOWER EXTREMITY VENOUS DOPPLER ULTRASOUND TECHNIQUE: Keevan Wolz-scale sonography with graded compression, as well as color Doppler and duplex ultrasound were performed to evaluate the lower extremity deep venous systems from the level of the common femoral vein and including the common femoral, femoral, profunda femoral, popliteal and calf veins including the posterior tibial, peroneal and gastrocnemius veins when visible. The superficial great saphenous vein was also interrogated. Spectral Doppler was utilized to evaluate flow at rest and with distal augmentation maneuvers in the common femoral, femoral and popliteal veins. COMPARISON:  None Available. FINDINGS: RIGHT LOWER EXTREMITY Common Femoral Vein: No evidence of thrombus. Normal compressibility, respiratory phasicity and response to augmentation. Saphenofemoral Junction: No evidence of thrombus. Normal compressibility and flow on color Doppler imaging. Profunda Femoral Vein: No evidence of thrombus. Normal compressibility and flow on color Doppler imaging. Femoral Vein: No evidence of thrombus. Normal compressibility, respiratory phasicity and response to augmentation. Popliteal Vein: No evidence of thrombus. Normal compressibility, respiratory phasicity and response to augmentation. Calf Veins: No evidence of thrombus. Normal compressibility and flow on color Doppler imaging. Superficial Great Saphenous Vein: No evidence of thrombus. Normal compressibility and flow on color Doppler imaging. Other Findings:  None. LEFT LOWER EXTREMITY Common Femoral Vein:  No evidence of thrombus. Normal compressibility, respiratory phasicity and response to augmentation. Saphenofemoral Junction: No evidence of thrombus. Normal compressibility and flow on color Doppler imaging. Profunda Femoral Vein: No evidence of thrombus. Normal compressibility and flow on color Doppler imaging. Femoral Vein: No evidence of thrombus. Normal compressibility, respiratory phasicity and response to augmentation. Popliteal Vein: No evidence of thrombus. Normal compressibility, respiratory phasicity and response to augmentation. Calf Veins: No evidence of thrombus. Normal compressibility and flow on color Doppler imaging. Superficial Great Saphenous Vein: No evidence of thrombus. Normal compressibility and flow on color Doppler imaging. Other Findings:  None. IMPRESSION: Directed duplex of the bilateral lower extremity negative for DVT Signed, Yvone Neu. Miachel Roux, RPVI [facility] Electronically Signed   By: Gilmer Mor D.O.   On: 03/04/2023 16:13    Procedures .Critical Care  Performed by: Franne Forts, DO Authorized by: Franne Forts, DO   Critical care provider statement:    Critical care time (minutes):  78   Critical care  was necessary to treat or prevent imminent or life-threatening deterioration of the following conditions:  Respiratory failure   Critical care was time spent personally by me on the following activities:  Development of treatment plan with patient or surrogate, discussions with consultants, evaluation of patient's response to treatment, examination of patient, ordering and review of laboratory studies, ordering and review of radiographic studies, ordering and performing treatments and interventions, pulse oximetry, re-evaluation of patient's condition and review of old charts   Care discussed with: admitting provider       Medications Ordered in ED Medications  cefTRIAXone (ROCEPHIN) 1 g in  sodium chloride 0.9 % 100 mL IVPB (has no administration in time range)  azithromycin (ZITHROMAX) 500 mg in sodium chloride 0.9 % 250 mL IVPB (has no administration in time range)  ipratropium-albuterol (DUONEB) 0.5-2.5 (3) MG/3ML nebulizer solution 3 mL (3 mLs Nebulization Given 03/04/23 1632)  methylPREDNISolone sodium succinate (SOLU-MEDROL) 125 mg/2 mL injection 125 mg (125 mg Intravenous Given 03/04/23 1600)  ketorolac (TORADOL) 15 MG/ML injection 15 mg (15 mg Intravenous Given 03/04/23 1559)    ED Course/ Medical Decision Making/ A&P                                 Medical Decision Making Amount and/or Complexity of Data Reviewed Labs: ordered. Radiology: ordered.  Risk Prescription drug management. Decision regarding hospitalization.   9:03 PM Brief HPI: 32 year old female with past medical history of anxiety, borderline personality disorder, depression, and kidney stones presenting for complaints of shortness of breath.   Presenting signs/symptoms: Patient is alert and oriented x 3, acute respiratory distress, hypoxic at 88% on room air, respiratory rate of 34, with a heart rate of 105 bpm.  Patient remains tachypneic with a rate of 32 breaths/min after 2 L nasal cannula placed.  Patient currently satting at 96%.  She has equal bilateral breath sounds with end expiratory wheezes.  No rales or rhonchi.  Some minimal swelling in the right lower extremity as compared to the left.  No previous history of DVT or PEs.  No blood thinner use.  No evidence of fluid overloaded state.  ECG demonstrates sinus tachycardia.  Differential diagnosis includes but is not limited to COPD exacerbation, pulmonary embolism, pneumonia or viral etiology, pneumothorax, pleural effusions, anemia, or other acute life-threatening etiologies of hypoxia.  Medication interventions: Solumedrol 125 mg iv, duonebs x 1, and Toradol given for sob, wheezing, hypoxia, and headache.   Imaging/labs: Chest x-ray  demonstrates no pneumothorax.  No focal pneumonias.  Possible vaping associated lung injuries.   Lower extremity ultrasounds negative.  Ultrasound lower extremity negative for DVTs.  D-dimer negative.  So PE unlikely..  Patient has contrast allergy.  Will obtain Noncon CT chest.  CT chest demonstrates: 1. Multiple centrilobular nodules in the lungs bilaterally, likely infectious or inflammatory. 2. Bilateral pulmonary nodules measuring up to 7 mm. Non-contrast chest CT at 3-6 months is recommended. If the nodules are stable at time of repeat CT, then future CT at 18-24 months (from today's scan) is considered optional for low-risk patients, but is recommended for high-risk patients.    Patient is considered high risk due to her previous tobacco use.  Will consider patient acute respiratory failure with hypoxia secondary to vaping associated lung injury versus COPD versus pneumonia.  COVID PCR negative.  Influenza negative.  RSV negative.  Some leukocytosis at 12.  Rocephin and azithromycin sent given for possible  pneumonia.  Recommend admission. Patient accepted by Dr. Randol Kern.         Final Clinical Impression(s) / ED Diagnoses Final diagnoses:  Acute respiratory failure with hypoxia (HCC)  Lung injury associated with vaping    Rx / DC Orders ED Discharge Orders     None         Franne Forts, DO 03/04/23 2103    Franne Forts, DO 03/04/23 2103

## 2023-03-04 NOTE — H&P (Signed)
TRH H&P   Patient Demographics:    Darlene Mcbride, is a 32 y.o. female  MRN: [id number]   DOB - [birth year]  Admit Date - 03/04/2023  Outpatient Primary MD for the patient is [facility], [facility]  Referring MD/NP/PA: Dr Wallace Cullens  Patient coming from: home  Chief Complaint  Patient presents with   Shortness of Breath      HPI:    Darlene Mcbride  is a 32 y.o. female, without significant past medical history, she presents to ED secondary to complaints of cough, shortness of breath, for last 10 days, reports symptoms started last Monday, reports dyspnea, cough, she had some subjective fever, and chills initially, reports her son was sick, but he developed her's sickness 3 days after her initial sickness, denies feeling previously sick like that, reports she is feeling very weak, fatigued as well, she denies sore throat, nasal congestion congestion, chest pain, any history of lung disease, no COPD, no asthma in the past, mother passed away last week due to malignancy, patient reports she is vaping over last year, she has been using tobacco before that, but she reports she did using any new vape, but she has been vaping excessively recently due to stress from her mother death, but she is significantly cut back on vaping due to her illness.  Denies any alcohol or substance abuse -in ED patient was significantly hypoxic 82% on room air, requiring 3 L initially which brought her up to 91, but this has improved with the steroid and nebulizer treatment, she is currently 95% on 3 L, but she remained tachypneic, with some increased work of breathing, proved with steroids, nebulizer treatments, she received IV Rocephin and azithromycin and [facility] hospital[facility] consulted to admit.    Review of systems:     A full 10 point Review of Systems was done, except as stated above, all other Review of Systems were  negative.   With Past History of the following :    Past Medical History:  Diagnosis Date   Anxiety    Borderline personality disorder (HCC)    Depression    Kidney stones    Mental disorder    Vaginal Pap smear, abnormal       Past Surgical History:  Procedure Laterality Date   APPENDECTOMY     LEEP     OVARIAN CYST REMOVAL     TUBAL LIGATION        Social History:     Social History   Tobacco Use   Smoking status: Former    Current packs/day: 0.50    Types: Cigarettes   Smokeless tobacco: Never   Tobacco comments:    wants to discuss options   Substance Use Topics   Alcohol use: No       Family History :     Family History  Problem Relation Age of Onset   Diabetes  Mother    Depression Mother    Hypertension Father    Depression Father    Post-traumatic stress disorder Father    Breast cancer Maternal Grandmother    Breast cancer Paternal Grandmother    Brain cancer Maternal Grandfather    Kidney cancer Maternal Grandfather    Colon cancer Paternal Grandfather       Home Medications:   Prior to Admission medications   Medication Sig Start Date End Date Taking? Authorizing Provider  acetaminophen (TYLENOL) 500 MG tablet Take 500 mg by mouth every 6 (six) hours as needed for moderate pain (pain score 4-6).   Yes [provider]  clonazePAM (KLONOPIN) 0.5 MG tablet Take 0.5 mg by mouth daily.   Yes [provider]  ibuprofen (ADVIL) 200 MG tablet Take 200 mg by mouth every 6 (six) hours as needed for mild pain (pain score 1-3).   Yes [provider]     Allergies:     Allergies  Allergen Reactions   Contrast Media [Iodinated Contrast Media] Hives     Physical Exam:   Vitals  Blood pressure 127/69, pulse 86, temperature 98 F (36.7 C), temperature source Oral, resp. rate (!) 29, height 5\' 4"  (1.626 m), weight 99.8 kg, SpO2 92%.   1. General Well, ill-appearing female, laying in bed in mild discomfort  2.  Normal affect and insight, Not Suicidal or Homicidal, Awake Alert, Oriented X 3.  3. No F.N deficits, ALL C.Nerves Intact, Strength 5/5 all 4 extremities, Sensation intact all 4 extremities, Plantars down going.  4. Ears and Eyes appear Normal, Conjunctivae clear, PERRLA. Moist Oral Mucosa.  5. Supple Neck, No JVD, No cervical lymphadenopathy appriciated, No Carotid Bruits.  6. Symmetrical Chest wall movement, scattered Rales and wheezing bilaterally, tachypneic  7.  Tachycardic, No Gallops, Rubs or Murmurs, No Parasternal Heave.  8. Positive Bowel Sounds, Abdomen Soft, No tenderness, No organomegaly appriciated,No rebound -guarding or rigidity.  9.  No Cyanosis, Normal Skin Turgor, No Skin Rash or Bruise.  10. Good muscle tone,  joints appear normal , no effusions, Normal ROM.    Data Review:    CBC Recent Labs  Lab 03/04/23 1448  WBC 12.0*  HGB 12.3  HCT 37.8  PLT 482*  MCV 93.1  MCH 30.3  MCHC 32.5  RDW 14.0  LYMPHSABS 1.8  MONOABS 0.5  EOSABS 0.1  BASOSABS 0.0   ------------------------------------------------------------------------------------------------------------------  Chemistries  Recent Labs  Lab 03/04/23 1448  NA 136  K 4.1  CL 99  CO2 26  GLUCOSE 108*  BUN 6  CREATININE 0.59  CALCIUM 8.8*  AST 17  ALT 28  ALKPHOS 91  BILITOT 0.2*   ------------------------------------------------------------------------------------------------------------------ estimated creatinine clearance is 113.7 mL/min (by C-G formula based on SCr of 0.59 mg/dL). ------------------------------------------------------------------------------------------------------------------ No results for input(s): "TSH", "T4TOTAL", "T3FREE", "THYROIDAB" in the last 72 hours.  Invalid input(s): "FREET3"  Coagulation profile No results for input(s): "INR", "PROTIME" in the last 168  hours. ------------------------------------------------------------------------------------------------------------------- Recent Labs    03/04/23 1628  DDIMER 0.47   -------------------------------------------------------------------------------------------------------------------  Cardiac Enzymes No results for input(s): "CKMB", "TROPONINI", "MYOGLOBIN" in the last 168 hours.  Invalid input(s): "CK" ------------------------------------------------------------------------------------------------------------------ No results found for: "BNP"   ---------------------------------------------------------------------------------------------------------------  Urinalysis    Component Value Date/Time   COLORURINE YELLOW 03/29/2020 1129   APPEARANCEUR HAZY (A) 03/29/2020 1129   LABSPEC 1.020 03/29/2020 1129   PHURINE 5.0 03/29/2020 1129   GLUCOSEU NEGATIVE 03/29/2020 1129   HGBUR LARGE (A) 03/29/2020 1129  BILIRUBINUR NEGATIVE 03/29/2020 1129   KETONESUR NEGATIVE 03/29/2020 1129   PROTEINUR 30 (A) 03/29/2020 1129   UROBILINOGEN 0.2 07/23/2014 1944   NITRITE NEGATIVE 03/29/2020 1129   LEUKOCYTESUR SMALL (A) 03/29/2020 1129    ----------------------------------------------------------------------------------------------------------------   Imaging Results:    CT Chest Wo Contrast  Result Date: 03/04/2023 CLINICAL DATA:  Respiratory illness, nondiagnostic x-ray. Shortness of breath and cough. EXAM: CT CHEST WITHOUT CONTRAST TECHNIQUE: Multidetector CT imaging of the chest was performed following the standard protocol without IV contrast. RADIATION DOSE REDUCTION: This exam was performed according to the departmental dose-optimization program which includes automated exposure control, adjustment of the mA and/or kV according to patient size and/or use of iterative reconstruction technique. COMPARISON:  None Available. FINDINGS: Cardiovascular: The heart is normal in size and there is  no pericardial effusion. The aorta and pulmonary trunk are normal in caliber. Mediastinum/Nodes: Enlarged lymph nodes are present in the mediastinum measuring up to 1.5 cm. No axillary lymphadenopathy. Evaluation of the hila is limited due to lack of IV contrast. The thyroid gland, trachea, and esophagus are within normal limits. Lungs/Pleura: Centrilobular nodules are present throughout the lungs bilaterally, greater on the left than on the right. No effusion or pneumothorax. 7 mm nodule is present in the left lower lobe, axial image 100. 5 mm nodule is present in the right upper lobe, axial image 75. Mild atelectasis or scarring is noted in the right middle lobe. Upper Abdomen: No acute abnormality. Musculoskeletal: Degenerative changes are present in the thoracic spine. No acute osseous abnormality. IMPRESSION: 1. Multiple centrilobular nodules in the lungs bilaterally, likely infectious or inflammatory. 2. Bilateral pulmonary nodules measuring up to 7 mm. Non-contrast chest CT at 3-6 months is recommended. If the nodules are stable at time of repeat CT, then future CT at 18-24 months (from today's scan) is considered optional for low-risk patients, but is recommended for high-risk patients. This recommendation follows the consensus statement: Guidelines for Management of Incidental Pulmonary Nodules Detected on CT Images: From the Fleischner Society 2017; Radiology 2017; 284:228-243. 3. Enlarged mediastinal lymph nodes, likely reactive. Electronically Signed   By: Thornell Sartorius M.D.   On: 03/04/2023 20:16   DG Chest 2 View  Result Date: 03/04/2023 CLINICAL DATA:  Short of breath and cough, hypoxia EXAM: CHEST - 2 VIEW COMPARISON:  Prior chest x-ray 05/26/2018 FINDINGS: Cardiac and mediastinal contours are within normal limits. Multifocal mild patchy airspace opacities predominantly in the bilateral lower lobes. Fine interstitial prominence. No pleural effusion or pneumothorax. Osseous structures are intact  and unremarkable. IMPRESSION: 1. Bilateral lower lobe bronchial wall thickening and reticulonodular airspace opacities. Findings are nonspecific and may represent inhalational lung disease versus acute bronchitis. Electronically Signed   By: Malachy Moan M.D.   On: 03/04/2023 16:53   US Venous Img Lower Bilateral  Result Date: 03/04/2023 CLINICAL DATA:  32 year old female with a history of hypoxia, lower extremity edema EXAM: BILATERAL LOWER EXTREMITY VENOUS DOPPLER ULTRASOUND TECHNIQUE: Gray-scale sonography with graded compression, as well as color Doppler and duplex ultrasound were performed to evaluate the lower extremity deep venous systems from the level of the common femoral vein and including the common femoral, femoral, profunda femoral, popliteal and calf veins including the posterior tibial, peroneal and gastrocnemius veins when visible. The superficial great saphenous vein was also interrogated. Spectral Doppler was utilized to evaluate flow at rest and with distal augmentation maneuvers in the common femoral, femoral and popliteal veins. COMPARISON:  None Available. FINDINGS: RIGHT LOWER EXTREMITY Common Femoral  Vein: No evidence of thrombus. Normal compressibility, respiratory phasicity and response to augmentation. Saphenofemoral Junction: No evidence of thrombus. Normal compressibility and flow on color Doppler imaging. Profunda Femoral Vein: No evidence of thrombus. Normal compressibility and flow on color Doppler imaging. Femoral Vein: No evidence of thrombus. Normal compressibility, respiratory phasicity and response to augmentation. Popliteal Vein: No evidence of thrombus. Normal compressibility, respiratory phasicity and response to augmentation. Calf Veins: No evidence of thrombus. Normal compressibility and flow on color Doppler imaging. Superficial Great Saphenous Vein: No evidence of thrombus. Normal compressibility and flow on color Doppler imaging. Other Findings:  None. LEFT  LOWER EXTREMITY Common Femoral Vein: No evidence of thrombus. Normal compressibility, respiratory phasicity and response to augmentation. Saphenofemoral Junction: No evidence of thrombus. Normal compressibility and flow on color Doppler imaging. Profunda Femoral Vein: No evidence of thrombus. Normal compressibility and flow on color Doppler imaging. Femoral Vein: No evidence of thrombus. Normal compressibility, respiratory phasicity and response to augmentation. Popliteal Vein: No evidence of thrombus. Normal compressibility, respiratory phasicity and response to augmentation. Calf Veins: No evidence of thrombus. Normal compressibility and flow on color Doppler imaging. Superficial Great Saphenous Vein: No evidence of thrombus. Normal compressibility and flow on color Doppler imaging. Other Findings:  None. IMPRESSION: Directed duplex of the bilateral lower extremity negative for DVT Signed, Yvone Neu. Miachel Roux, RPVI [facility] Electronically Signed   By: Gilmer Mor D.O.   On: 03/04/2023 16:13     EKG:  Vent. rate 93 BPM PR interval 154 ms QRS duration 88 ms QT/QTcB 350/436 ms P-R-T axes 98 89 44 Sinus rhythm  Assessment & Plan:    Principal Problem:   Acute respiratory failure with hypoxia (HCC)  Acute respiratory failure with hypoxia pulmonary nodules Possible acute viral bronchitis Possible vaping related lung injury -Presents with dyspnea, tachypnea, hypoxia 82% on room air, she is breathing 33/min upon my evaluation - possibly to viral illness, as she reports that her son was sick as well, will check respiratory viral panel (COVID-19, flu, RSV is negative) -Start on empiric antibiotic coverage including azithromycin, Rocephin, follow on Legionella, strep pneumonia antigen. -Will check CRP, ESR -Started on IV antibiotics given significant wheezing on presentation -Will encourage use incentive spirometry and flutter  valve -Continue with oxygen, wean as tolerated. -Very likely acute process either Vape/e-cigarettes related injury, or viral acute bronchitis, but on previous CT abdomen done in August she was noted to have some pulmonary nodules at the bases, he is having some adenopathy as well, sarcoidosis is very low on the differential, but I will obtain ACE level. -D-dimers is negative -Will need pulmonary referral  upon discharge to follow on pulmonary nodules.  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    Grief reaction - her mother passed away last week, will consult chaplain  DVT Prophylaxis  Lovenox  AM Labs Ordered, also please review Full Orders  Family Communication: Admission, patients condition and plan of care including tests being ordered have been discussed with the patient and family at bedside* who indicate understanding and agree with the plan and Code Status.  Code Status full code  Likely DC to home  Consults called: None  Admission status: Observation  Time spent in minutes : 70 minutes   Huey Bienenstock M.D on 03/04/2023 at 9:29 PM   [facility] - Office  [phone]

## 2023-03-04 NOTE — Discharge Instructions (Signed)
Go directly to the emergency department for further evaluation.

## 2023-03-05 DIAGNOSIS — Z6837 Body mass index (BMI) 37.0-37.9, adult: Secondary | ICD-10-CM | POA: Diagnosis not present

## 2023-03-05 DIAGNOSIS — F603 Borderline personality disorder: Secondary | ICD-10-CM | POA: Diagnosis present

## 2023-03-05 DIAGNOSIS — R053 Chronic cough: Secondary | ICD-10-CM | POA: Diagnosis present

## 2023-03-05 DIAGNOSIS — F4320 Adjustment disorder, unspecified: Secondary | ICD-10-CM | POA: Diagnosis present

## 2023-03-05 DIAGNOSIS — D72823 Leukemoid reaction: Secondary | ICD-10-CM | POA: Diagnosis not present

## 2023-03-05 DIAGNOSIS — Z87442 Personal history of urinary calculi: Secondary | ICD-10-CM | POA: Diagnosis not present

## 2023-03-05 DIAGNOSIS — F419 Anxiety disorder, unspecified: Secondary | ICD-10-CM | POA: Diagnosis present

## 2023-03-05 DIAGNOSIS — Z634 Disappearance and death of family member: Secondary | ICD-10-CM | POA: Diagnosis not present

## 2023-03-05 DIAGNOSIS — R0602 Shortness of breath: Secondary | ICD-10-CM | POA: Diagnosis present

## 2023-03-05 DIAGNOSIS — E66812 Obesity, class 2: Secondary | ICD-10-CM | POA: Diagnosis present

## 2023-03-05 DIAGNOSIS — U070 Vaping-related disorder: Secondary | ICD-10-CM | POA: Diagnosis present

## 2023-03-05 DIAGNOSIS — J9601 Acute respiratory failure with hypoxia: Secondary | ICD-10-CM | POA: Diagnosis present

## 2023-03-05 DIAGNOSIS — F17210 Nicotine dependence, cigarettes, uncomplicated: Secondary | ICD-10-CM | POA: Diagnosis present

## 2023-03-05 DIAGNOSIS — F17290 Nicotine dependence, other tobacco product, uncomplicated: Secondary | ICD-10-CM | POA: Diagnosis present

## 2023-03-05 DIAGNOSIS — Z8249 Family history of ischemic heart disease and other diseases of the circulatory system: Secondary | ICD-10-CM | POA: Diagnosis not present

## 2023-03-05 DIAGNOSIS — T380X5A Adverse effect of glucocorticoids and synthetic analogues, initial encounter: Secondary | ICD-10-CM | POA: Diagnosis not present

## 2023-03-05 DIAGNOSIS — Z79899 Other long term (current) drug therapy: Secondary | ICD-10-CM | POA: Diagnosis not present

## 2023-03-05 DIAGNOSIS — Z1152 Encounter for screening for COVID-19: Secondary | ICD-10-CM | POA: Diagnosis not present

## 2023-03-05 DIAGNOSIS — Z818 Family history of other mental and behavioral disorders: Secondary | ICD-10-CM | POA: Diagnosis not present

## 2023-03-05 DIAGNOSIS — J157 Pneumonia due to Mycoplasma pneumoniae: Secondary | ICD-10-CM | POA: Diagnosis present

## 2023-03-05 DIAGNOSIS — F32A Depression, unspecified: Secondary | ICD-10-CM | POA: Diagnosis present

## 2023-03-05 DIAGNOSIS — Z91041 Radiographic dye allergy status: Secondary | ICD-10-CM | POA: Diagnosis not present

## 2023-03-05 LAB — CBC
HCT: 37.3 % (ref 36.0–46.0)
Hemoglobin: 11.9 g/dL — ABNORMAL LOW (ref 12.0–15.0)
MCH: 30.1 pg (ref 26.0–34.0)
MCHC: 31.9 g/dL (ref 30.0–36.0)
MCV: 94.2 fL (ref 80.0–100.0)
Platelets: 456 10*3/uL — ABNORMAL HIGH (ref 150–400)
RBC: 3.96 MIL/uL (ref 3.87–5.11)
RDW: 13.8 % (ref 11.5–15.5)
WBC: 12.6 10*3/uL — ABNORMAL HIGH (ref 4.0–10.5)
nRBC: 0 % (ref 0.0–0.2)

## 2023-03-05 LAB — RESPIRATORY PANEL BY PCR

## 2023-03-05 LAB — BASIC METABOLIC PANEL
Anion gap: 10 (ref 5–15)
BUN: 11 mg/dL (ref 6–20)
CO2: 24 mmol/L (ref 22–32)
Calcium: 8.7 mg/dL — ABNORMAL LOW (ref 8.9–10.3)
Chloride: 105 mmol/L (ref 98–111)
Creatinine, Ser: 0.63 mg/dL (ref 0.44–1.00)
GFR, Estimated: >60 mL/min (ref >60)
Glucose, Bld: 120 mg/dL — ABNORMAL HIGH (ref 70–99)
Potassium: 4 mmol/L (ref 3.5–5.1)
Sodium: 139 mmol/L (ref 135–145)

## 2023-03-05 LAB — SEDIMENTATION RATE: Sed Rate: 120 mm/h — ABNORMAL HIGH (ref 0–22)

## 2023-03-05 LAB — HIV ANTIBODY (ROUTINE TESTING W REFLEX): HIV Screen 4th Generation wRfx: NONREACTIVE

## 2023-03-05 LAB — C-REACTIVE PROTEIN: CRP: 6.2 mg/dL — ABNORMAL HIGH (ref <1.0)

## 2023-03-05 MED ORDER — GUAIFENESIN 100 MG/5ML PO LIQD
5.0000 mL | Freq: Once | ORAL | Status: AC
  Administered 2023-03-05: 5 mL via ORAL
  Filled 2023-03-05: qty 5

## 2023-03-05 MED ORDER — ORAL CARE MOUTH RINSE: 15.0000 mL | OROMUCOSAL | Status: DC | PRN

## 2023-03-05 MED ORDER — CLONAZEPAM 0.25 MG PO TBDP
0.2500 mg | ORAL_TABLET | Freq: Once | ORAL | Status: AC
  Administered 2023-03-05: 0.25 mg via ORAL
  Filled 2023-03-05: qty 1

## 2023-03-05 MED ORDER — NICOTINE 21 MG/24HR TD PT24
21.0000 mg | MEDICATED_PATCH | Freq: Every day | TRANSDERMAL | Status: DC | PRN
  Administered 2023-03-05: 21 mg via TRANSDERMAL
  Filled 2023-03-05: qty 1

## 2023-03-05 MED ORDER — METHYLPREDNISOLONE SODIUM SUCC 40 MG IJ SOLR
40.0000 mg | Freq: Two times a day (BID) | INTRAMUSCULAR | Status: DC
  Administered 2023-03-05 – 2023-03-07 (×4): 40 mg via INTRAVENOUS
  Filled 2023-03-05 (×4): qty 1

## 2023-03-05 MED ORDER — ENSURE ENLIVE PO LIQD
237.0000 mL | Freq: Two times a day (BID) | ORAL | Status: DC
Start: 2023-03-05 — End: 2023-03-07
  Administered 2023-03-07: 237 mL via ORAL

## 2023-03-05 NOTE — Progress Notes (Signed)
PROGRESS NOTE   PANAYIOTA BRANDNER  ZOX:[id number] DOB: [birth year] DOA: 03/04/2023 PCP: Practice, [facility]   Chief Complaint  Patient presents with   Shortness of Breath   Level of care: Med-Surg  Brief Admission History:  32 y.o. female, without significant past medical history, she presents to ED secondary to complaints of cough, shortness of breath, for last 10 days, reports symptoms started last Monday, reports dyspnea, cough, she had some subjective fever, and chills initially, reports her son was sick, but he developed her's sickness 3 days after her initial sickness, denies feeling previously sick like that, reports she is feeling very weak, fatigued as well, she denies sore throat, nasal congestion congestion, chest pain, any history of lung disease, no COPD, no asthma in the past, mother passed away last week due to malignancy, patient reports she is vaping over last year, she has been using tobacco before that, but she reports she did using any new vape, but she has been vaping excessively recently due to stress from her mother death, but she is significantly cut back on vaping due to her illness.  Denies any alcohol or substance abuse -in ED patient was significantly hypoxic 82% on room air, requiring 3 L initially which brought her up to 91, but this has improved with the steroid and nebulizer treatment, she is currently 95% on 3 L, but she remained tachypneic, with some increased work of breathing, proved with steroids, nebulizer treatments, she received IV Rocephin and azithromycin and [facility] hospitalist consulted to admit.   Assessment and Plan:  Acute respiratory failure with hypoxia - secondary to pneumonia - continue current treatments - wean oxygen as able to room air  - currently remains on 4L/min [place]   Mycoplasma Pneumonia / Walking pneumonia - continue current antibiotics and supportive measures - wean oxygen to room air as able  - consult to RT for support - added  flutter valve  - incentive spirometry added   Pulmonary Nodules - ambulatory referral to pulmonary nodule clinic sent 10/24  Tobacco Use - counseled on cessation - offered nicotine patch  - pt strongly advised to stop using vaping devices  Leukocytosis  - likely leukemoid reaction from steroids - recheck in AM   DVT prophylaxis: enoxaparin  Code Status: Full  Family Communication: bedside update 10/24  Disposition: anticipate home in 1-2 days if continue to improve    Consultants:   Procedures:   Antimicrobials:  Ceftriaxone 10/23>> Azithromycin 10/23>>   Subjective: Pt remains very short of breath, nonproductive cough, chest congestion, wheezing and denies fever, chills and chest pain.    Objective: Vitals:   03/05/23 0528 03/05/23 0702 03/05/23 1116 03/05/23 1219  BP: 126/68   (!) 149/76  Pulse: 88   100  Resp: 18   18  Temp: 98 F (36.7 C)   98.5 F (36.9 C)  TempSrc:    Oral  SpO2: 95% 96% 95% 91%  Weight:      Height:        Intake/Output Summary (Last 24 hours) at 03/05/2023 1403 Last data filed at 03/05/2023 0300 Gross per 24 hour  Intake 250 ml  Output --  Net 250 ml   Filed Weights   03/04/23 1417  Weight: 99.8 kg   Examination:  General exam: Appears calm and comfortable  Respiratory system: Clear to auscultation. Respiratory effort normal. Cardiovascular system: normal S1 & S2 heard. No JVD, murmurs, rubs, gallops or clicks. No pedal edema. Gastrointestinal system: Abdomen is nondistended, soft  and nontender. No organomegaly or masses felt. Normal bowel sounds heard. Central nervous system: Alert and oriented. No focal neurological deficits. Extremities: Symmetric 5 x 5 power. Skin: No rashes, lesions or ulcers. Psychiatry: Judgement and insight appear normal. Mood & affect appropriate.   Data Reviewed: I have personally reviewed following labs and imaging studies  CBC: Recent Labs  Lab 03/04/23 1448 03/05/23 0452  WBC 12.0* 12.6*   NEUTROABS 9.5*  --   HGB 12.3 11.9*  HCT 37.8 37.3  MCV 93.1 94.2  PLT 482* 456*    Basic Metabolic Panel: Recent Labs  Lab 03/04/23 1448 03/05/23 0452  NA 136 139  K 4.1 4.0  CL 99 105  CO2 26 24  GLUCOSE 108* 120*  BUN 6 11  CREATININE 0.59 0.63  CALCIUM 8.8* 8.7*    CBG: No results for input(s): "GLUCAP" in the last 168 hours.  Recent Results (from the past 240 hour(s))  Resp panel by RT-PCR (RSV, Flu A&B, Covid) Anterior Nasal Swab     Status: None   Collection Time: 03/04/23  2:35 PM   Specimen: Anterior Nasal Swab  Result Value Ref Range Status   SARS Coronavirus 2 by RT PCR NEGATIVE NEGATIVE Final    Comment: (NOTE) SARS-CoV-2 target nucleic acids are NOT DETECTED.  The SARS-CoV-2 RNA is generally detectable in upper respiratory specimens during the acute phase of infection. The lowest concentration of SARS-CoV-2 viral copies this assay can detect is 138 copies/mL. A negative result does not preclude SARS-Cov-2 infection and should not be used as the sole basis for treatment or other patient management decisions. A negative result may occur with  improper specimen collection/handling, submission of specimen other than nasopharyngeal swab, presence of viral mutation(s) within the areas targeted by this assay, and inadequate number of viral copies(<138 copies/mL). A negative result must be combined with clinical observations, patient history, and epidemiological information. The expected result is Negative.  Fact Sheet for Patients:  BloggerCourse.com  Fact Sheet for Healthcare Providers:  SeriousBroker.it  This test is no t yet approved or cleared by the [place] FDA and  has been authorized for detection and/or diagnosis of SARS-CoV-2 by FDA under an Emergency Use Authorization (EUA). This EUA will remain  in effect (meaning this test can be used) for the duration of the COVID-19 declaration under  Section 564(b)(1) of the Act, 21 U.S.C.section 360bbb-3(b)(1), unless the authorization is terminated  or revoked sooner.       Influenza A by PCR NEGATIVE NEGATIVE Final   Influenza B by PCR NEGATIVE NEGATIVE Final    Comment: (NOTE) The Xpert Xpress SARS-CoV-2/FLU/RSV plus assay is intended as an aid in the diagnosis of influenza from Nasopharyngeal swab specimens and should not be used as a sole basis for treatment. Nasal washings and aspirates are unacceptable for Xpert Xpress SARS-CoV-2/FLU/RSV testing.  Fact Sheet for Patients: BloggerCourse.com  Fact Sheet for Healthcare Providers: SeriousBroker.it  This test is not yet approved or cleared by the [place] FDA and has been authorized for detection and/or diagnosis of SARS-CoV-2 by FDA under an Emergency Use Authorization (EUA). This EUA will remain in effect (meaning this test can be used) for the duration of the COVID-19 declaration under Section 564(b)(1) of the Act, 21 U.S.C. section 360bbb-3(b)(1), unless the authorization is terminated or revoked.     Resp Syncytial Virus by PCR NEGATIVE NEGATIVE Final    Comment: (NOTE) Fact Sheet for Patients: BloggerCourse.com  Fact Sheet for Healthcare Providers: SeriousBroker.it  This  test is not yet approved or cleared by the [place] and has been authorized for detection and/or diagnosis of SARS-CoV-2 by FDA under an Emergency Use Authorization (EUA). This EUA will remain in effect (meaning this test can be used) for the duration of the COVID-19 declaration under Section 564(b)(1) of the Act, 21 U.S.C. section 360bbb-3(b)(1), unless the authorization is terminated or revoked.  Performed at [facility], [address] [place], [place] [address]   Blood culture (routine x 2)     Status: None (Preliminary result)   Collection Time: 03/04/23  3:45 PM    Specimen: BLOOD  Result Value Ref Range Status   Specimen Description BLOOD LEFT ASSIST CONTROL  Final   Special Requests   Final    BOTTLES DRAWN AEROBIC AND ANAEROBIC Blood Culture adequate volume   Culture   Final    NO GROWTH < 24 HOURS Performed at [facility], [address], [place], [place] [address]    Report Status PENDING  Incomplete  Blood culture (routine x 2)     Status: None (Preliminary result)   Collection Time: 03/04/23  4:28 PM   Specimen: Right Antecubital; Blood  Result Value Ref Range Status   Specimen Description RIGHT ANTECUBITAL  Final   Special Requests   Final    BOTTLES DRAWN AEROBIC AND ANAEROBIC Blood Culture results may not be optimal due to an excessive volume of blood received in culture bottles   Culture   Final    NO GROWTH < 24 HOURS Performed at [facility], [address] [place], [place] [address]    Report Status PENDING  Incomplete  Respiratory (~20 pathogens) panel by PCR     Status: Abnormal   Collection Time: 03/04/23  9:54 PM   Specimen: Nasopharyngeal Swab; Respiratory  Result Value Ref Range Status   Adenovirus NOT DETECTED NOT DETECTED Final   Coronavirus 229E NOT DETECTED NOT DETECTED Final    Comment: (NOTE) The Coronavirus on the Respiratory Panel, DOES NOT test for the novel  Coronavirus (2019 nCoV)    Coronavirus HKU1 NOT DETECTED NOT DETECTED Final   Coronavirus NL63 NOT DETECTED NOT DETECTED Final   Coronavirus OC43 NOT DETECTED NOT DETECTED Final   Metapneumovirus NOT DETECTED NOT DETECTED Final   Rhinovirus / Enterovirus NOT DETECTED NOT DETECTED Final   Influenza A NOT DETECTED NOT DETECTED Final   Influenza B NOT DETECTED NOT DETECTED Final   Parainfluenza Virus 1 NOT DETECTED NOT DETECTED Final   Parainfluenza Virus 2 NOT DETECTED NOT DETECTED Final   Parainfluenza Virus 3 NOT DETECTED NOT DETECTED Final   Parainfluenza Virus 4 NOT DETECTED NOT DETECTED Final   Respiratory Syncytial Virus NOT DETECTED NOT DETECTED  Final   Bordetella pertussis NOT DETECTED NOT DETECTED Final   Bordetella Parapertussis NOT DETECTED NOT DETECTED Final   Chlamydophila pneumoniae NOT DETECTED NOT DETECTED Final   Mycoplasma pneumoniae DETECTED (A) NOT DETECTED Final    Comment: Performed at [facility], [address], [place], [place] [address]     Radiology Studies: CT Chest Wo Contrast  Result Date: 03/04/2023 CLINICAL DATA:  Respiratory illness, nondiagnostic x-ray. Shortness of breath and cough. EXAM: CT CHEST WITHOUT CONTRAST TECHNIQUE: Multidetector CT imaging of the chest was performed following the standard protocol without IV contrast. RADIATION DOSE REDUCTION: This exam was performed according to the departmental dose-optimization program which includes automated exposure control, adjustment of the mA and/or kV according to patient size and/or use of iterative  reconstruction technique. COMPARISON:  None Available. FINDINGS: Cardiovascular: The heart is normal in size and there is no pericardial effusion. The aorta and pulmonary trunk are normal in caliber. Mediastinum/Nodes: Enlarged lymph nodes are present in the mediastinum measuring up to 1.5 cm. No axillary lymphadenopathy. Evaluation of the hila is limited due to lack of IV contrast. The thyroid gland, trachea, and esophagus are within normal limits. Lungs/Pleura: Centrilobular nodules are present throughout the lungs bilaterally, greater on the left than on the right. No effusion or pneumothorax. 7 mm nodule is present in the left lower lobe, axial image 100. 5 mm nodule is present in the right upper lobe, axial image 75. Mild atelectasis or scarring is noted in the right middle lobe. Upper Abdomen: No acute abnormality. Musculoskeletal: Degenerative changes are present in the thoracic spine. No acute osseous abnormality. IMPRESSION: 1. Multiple centrilobular nodules in the lungs bilaterally, likely infectious or inflammatory. 2. Bilateral pulmonary nodules  measuring up to 7 mm. Non-contrast chest CT at 3-6 months is recommended. If the nodules are stable at time of repeat CT, then future CT at 18-24 months (from today's scan) is considered optional for low-risk patients, but is recommended for high-risk patients. This recommendation follows the consensus statement: Guidelines for Management of Incidental Pulmonary Nodules Detected on CT Images: From the Fleischner Society 2017; Radiology 2017; 284:228-243. 3. Enlarged mediastinal lymph nodes, likely reactive. Electronically Signed   By: Thornell Sartorius M.D.   On: 03/04/2023 20:16   DG Chest 2 View  Result Date: 03/04/2023 CLINICAL DATA:  Short of breath and cough, hypoxia EXAM: CHEST - 2 VIEW COMPARISON:  Prior chest x-ray 05/26/2018 FINDINGS: Cardiac and mediastinal contours are within normal limits. Multifocal mild patchy airspace opacities predominantly in the bilateral lower lobes. Fine interstitial prominence. No pleural effusion or pneumothorax. Osseous structures are intact and unremarkable. IMPRESSION: 1. Bilateral lower lobe bronchial wall thickening and reticulonodular airspace opacities. Findings are nonspecific and may represent inhalational lung disease versus acute bronchitis. Electronically Signed   By: Malachy Moan M.D.   On: 03/04/2023 16:53   US Venous Img Lower Bilateral  Result Date: 03/04/2023 CLINICAL DATA:  32 year old female with a history of hypoxia, lower extremity edema EXAM: BILATERAL LOWER EXTREMITY VENOUS DOPPLER ULTRASOUND TECHNIQUE: Gray-scale sonography with graded compression, as well as color Doppler and duplex ultrasound were performed to evaluate the lower extremity deep venous systems from the level of the common femoral vein and including the common femoral, femoral, profunda femoral, popliteal and calf veins including the posterior tibial, peroneal and gastrocnemius veins when visible. The superficial great saphenous vein was also interrogated. Spectral Doppler was  utilized to evaluate flow at rest and with distal augmentation maneuvers in the common femoral, femoral and popliteal veins. COMPARISON:  None Available. FINDINGS: RIGHT LOWER EXTREMITY Common Femoral Vein: No evidence of thrombus. Normal compressibility, respiratory phasicity and response to augmentation. Saphenofemoral Junction: No evidence of thrombus. Normal compressibility and flow on color Doppler imaging. Profunda Femoral Vein: No evidence of thrombus. Normal compressibility and flow on color Doppler imaging. Femoral Vein: No evidence of thrombus. Normal compressibility, respiratory phasicity and response to augmentation. Popliteal Vein: No evidence of thrombus. Normal compressibility, respiratory phasicity and response to augmentation. Calf Veins: No evidence of thrombus. Normal compressibility and flow on color Doppler imaging. Superficial Great Saphenous Vein: No evidence of thrombus. Normal compressibility and flow on color Doppler imaging. Other Findings:  None. LEFT LOWER EXTREMITY Common Femoral Vein: No evidence of thrombus. Normal compressibility, respiratory phasicity and response  to augmentation. Saphenofemoral Junction: No evidence of thrombus. Normal compressibility and flow on color Doppler imaging. Profunda Femoral Vein: No evidence of thrombus. Normal compressibility and flow on color Doppler imaging. Femoral Vein: No evidence of thrombus. Normal compressibility, respiratory phasicity and response to augmentation. Popliteal Vein: No evidence of thrombus. Normal compressibility, respiratory phasicity and response to augmentation. Calf Veins: No evidence of thrombus. Normal compressibility and flow on color Doppler imaging. Superficial Great Saphenous Vein: No evidence of thrombus. Normal compressibility and flow on color Doppler imaging. Other Findings:  None. IMPRESSION: Directed duplex of the bilateral lower extremity negative for DVT Signed, Yvone Neu. Miachel Roux, RPVI [facility] Radiology Electronically Signed   By: Gilmer Mor D.O.   On: 03/04/2023 16:13    Scheduled Meds:  clonazePAM  0.5 mg Oral Daily   enoxaparin (LOVENOX) injection  40 mg Subcutaneous Q24H   feeding supplement  237 mL Oral BID BM   ipratropium-albuterol  3 mL Nebulization Q4H   methylPREDNISolone (SOLU-MEDROL) injection  40 mg Intravenous Q12H   pantoprazole  40 mg Oral Daily   Continuous Infusions:  azithromycin 500 mg (03/04/23 2136)   cefTRIAXone (ROCEPHIN)  IV       LOS: 0 days   Time spent: 48 mins  Aishwarya Shiplett Laural Benes, MD How to contact the [facility] Attending or Consulting provider 7A - 7P or covering provider during after hours 7P -7A, for this patient?  Check the care team in [facility] and look for a) attending/consulting [facility] provider listed and b) the [facility] team listed Log into [email or web address] to find provider on call.  Locate the [facility] provider you are looking for under [facility] and page to a number that you can be directly reached. If you still have difficulty reaching the provider, please page the [facility] (Director on Call) for the Hospitalists listed on amion for assistance.  03/05/2023, 2:03 PM

## 2023-03-05 NOTE — Hospital Course (Signed)
32 y.o. female, without significant past medical history, she presents to ED secondary to complaints of cough, shortness of breath, for last 10 days, reports symptoms started last Monday, reports dyspnea, cough, she had some subjective fever, and chills initially, reports her son was sick, but he developed her's sickness 3 days after her initial sickness, denies feeling previously sick like that, reports she is feeling very weak, fatigued as well, she denies sore throat, nasal congestion congestion, chest pain, any history of lung disease, no COPD, no asthma in the past, mother passed away last week due to malignancy, patient reports she is vaping over last year, she has been using tobacco before that, but she reports she did using any new vape, but she has been vaping excessively recently due to stress from her mother death, but she is significantly cut back on vaping due to her illness.  Denies any alcohol or substance abuse -in ED patient was significantly hypoxic 82% on room air, requiring 3 L initially which brought her up to 91, but this has improved with the steroid and nebulizer treatment, she is currently 95% on 3 L, but she remained tachypneic, with some increased work of breathing, proved with steroids, nebulizer treatments, she received IV Rocephin and azithromycin and [facility] hospitalist consulted to admit.

## 2023-03-05 NOTE — Progress Notes (Signed)
   03/05/23 1250  TOC Brief Assessment  Insurance and Status Reviewed  Patient has primary care physician Yes  Home environment has been reviewed Home  Prior level of function: Independent  Prior/Current Home Services No current home services  Social Determinants of Health Reivew SDOH reviewed no interventions necessary  Readmission risk has been reviewed Yes  Transition of care needs no transition of care needs at this time   Transition of Care Department [facility]) has reviewed patient and no TOC needs have been identified at this time. We will continue to monitor patient advancement through interdisciplinary progression rounds. If new patient transition needs arise, please place a TOC consult.

## 2023-03-05 NOTE — Progress Notes (Signed)
   03/05/23 1135  Spiritual Encounters  Type of Visit Initial  Care provided to: Pt not available  Referral source Other (comment) (Spiritual Consult)  Reason for visit Routine spiritual support  OnCall Visit No   Chaplain responded to a spiritual consult for support. The patient, Yochum was sleeping at the time of my visit.   Valerie Roys [facility]  [phone]

## 2023-03-05 NOTE — Plan of Care (Signed)
  Problem: Education: Goal: Knowledge of General Education information will improve Description: Including pain rating scale, medication(s)/side effects and non-pharmacologic comfort measures Outcome: Progressing   Problem: Activity: Goal: Risk for activity intolerance will decrease Outcome: Progressing   Problem: Coping: Goal: Level of anxiety will decrease Outcome: Progressing   Problem: Pain Management: Goal: General experience of comfort will improve Outcome: Progressing   Problem: Skin Integrity: Goal: Risk for impaired skin integrity will decrease Outcome: Progressing   Problem: Education: Goal: Knowledge of disease or condition will improve Outcome: Progressing

## 2023-03-06 DIAGNOSIS — J9601 Acute respiratory failure with hypoxia: Secondary | ICD-10-CM | POA: Diagnosis not present

## 2023-03-06 DIAGNOSIS — J157 Pneumonia due to Mycoplasma pneumoniae: Secondary | ICD-10-CM | POA: Diagnosis not present

## 2023-03-06 LAB — CBC
HCT: 34.8 % — ABNORMAL LOW (ref 36.0–46.0)
Hemoglobin: 11.3 g/dL — ABNORMAL LOW (ref 12.0–15.0)
MCH: 30.5 pg (ref 26.0–34.0)
MCHC: 32.5 g/dL (ref 30.0–36.0)
MCV: 93.8 fL (ref 80.0–100.0)
Platelets: 471 10*3/uL — ABNORMAL HIGH (ref 150–400)
RBC: 3.71 MIL/uL — ABNORMAL LOW (ref 3.87–5.11)
RDW: 14.1 % (ref 11.5–15.5)
WBC: 16.9 10*3/uL — ABNORMAL HIGH (ref 4.0–10.5)
nRBC: 0 % (ref 0.0–0.2)

## 2023-03-06 LAB — C-REACTIVE PROTEIN: CRP: 3.3 mg/dL — ABNORMAL HIGH (ref <1.0)

## 2023-03-06 LAB — ANGIOTENSIN CONVERTING ENZYME: Angiotensin-Converting Enzyme: 34 U/L (ref 14–82)

## 2023-03-06 LAB — SEDIMENTATION RATE: Sed Rate: 106 mm/h — ABNORMAL HIGH (ref 0–22)

## 2023-03-06 MED ORDER — IPRATROPIUM-ALBUTEROL 0.5-2.5 (3) MG/3ML IN SOLN
3.0000 mL | Freq: Three times a day (TID) | RESPIRATORY_TRACT | Status: DC
  Administered 2023-03-07 (×2): 3 mL via RESPIRATORY_TRACT
  Filled 2023-03-06 (×2): qty 3

## 2023-03-06 MED ORDER — CLONAZEPAM 0.5 MG PO TABS
0.5000 mg | ORAL_TABLET | Freq: Two times a day (BID) | ORAL | Status: DC
  Administered 2023-03-06 – 2023-03-07 (×2): 0.5 mg via ORAL
  Filled 2023-03-06 (×2): qty 1

## 2023-03-06 MED ORDER — HYDROCOD POLI-CHLORPHE POLI ER 10-8 MG/5ML PO SUER
5.0000 mL | Freq: Two times a day (BID) | ORAL | Status: DC | PRN
  Administered 2023-03-06: 5 mL via ORAL
  Filled 2023-03-06: qty 5

## 2023-03-06 MED ORDER — ACETAMINOPHEN 650 MG RE SUPP: 650.0000 mg | Freq: Four times a day (QID) | RECTAL | Status: DC

## 2023-03-06 MED ORDER — GUAIFENESIN ER 600 MG PO TB12
1200.0000 mg | ORAL_TABLET | Freq: Two times a day (BID) | ORAL | Status: DC
  Administered 2023-03-06 – 2023-03-07 (×3): 1200 mg via ORAL
  Filled 2023-03-06 (×3): qty 2

## 2023-03-06 MED ORDER — ACETAMINOPHEN 325 MG PO TABS
650.0000 mg | ORAL_TABLET | Freq: Four times a day (QID) | ORAL | Status: DC
  Administered 2023-03-06 – 2023-03-07 (×5): 650 mg via ORAL
  Filled 2023-03-06 (×5): qty 2

## 2023-03-06 NOTE — Plan of Care (Signed)
Rested well during the night. MEWS color yellow at 2200 due to RR and HR. Patient had been coughing. Medication ordered for non productive cough. Rechecked vital signs once patient settled down and MEWS color back to green.  Problem: Education: Goal: Knowledge of General Education information will improve Description: Including pain rating scale, medication(s)/side effects and non-pharmacologic comfort measures Outcome: Progressing   Problem: Health Behavior/Discharge Planning: Goal: Ability to manage health-related needs will improve Outcome: Progressing   Problem: Clinical Measurements: Goal: Ability to maintain clinical measurements within normal limits will improve Outcome: Progressing Goal: Will remain free from infection Outcome: Progressing Goal: Diagnostic test results will improve Outcome: Progressing Goal: Cardiovascular complication will be avoided Outcome: Progressing   Problem: Activity: Goal: Risk for activity intolerance will decrease Outcome: Progressing   Problem: Nutrition: Goal: Adequate nutrition will be maintained Outcome: Progressing   Problem: Coping: Goal: Level of anxiety will decrease Outcome: Progressing   Problem: Elimination: Goal: Will not experience complications related to bowel motility Outcome: Progressing Goal: Will not experience complications related to urinary retention Outcome: Progressing   Problem: Pain Management: Goal: General experience of comfort will improve Outcome: Progressing   Problem: Safety: Goal: Ability to remain free from injury will improve Outcome: Progressing   Problem: Skin Integrity: Goal: Risk for impaired skin integrity will decrease Outcome: Progressing   Problem: Education: Goal: Knowledge of disease or condition will improve Outcome: Progressing Goal: Knowledge of the prescribed therapeutic regimen will improve Outcome: Progressing Goal: Individualized Educational Video(s) Outcome: Progressing    Problem: Activity: Goal: Ability to tolerate increased activity will improve Outcome: Progressing Goal: Will verbalize the importance of balancing activity with adequate rest periods Outcome: Progressing   Problem: Respiratory: Goal: Ability to maintain a clear airway will improve Outcome: Progressing Goal: Ability to maintain adequate ventilation will improve Outcome: Progressing   Problem: Activity: Goal: Ability to tolerate increased activity will improve Outcome: Progressing   Problem: Clinical Measurements: Goal: Ability to maintain a body temperature in the normal range will improve Outcome: Progressing   Problem: Respiratory: Goal: Ability to maintain adequate ventilation will improve Outcome: Progressing Goal: Ability to maintain a clear airway will improve Outcome: Progressing   Problem: Clinical Measurements: Goal: Respiratory complications will improve Outcome: Not Progressing   Problem: Respiratory: Goal: Levels of oxygenation will improve Outcome: Not Progressing

## 2023-03-06 NOTE — Plan of Care (Signed)
  Problem: Clinical Measurements: Goal: Respiratory complications will improve Outcome: Progressing   Problem: Pain Management: Goal: General experience of comfort will improve Outcome: Progressing   Problem: Safety: Goal: Ability to remain free from injury will improve Outcome: Progressing   Problem: Skin Integrity: Goal: Risk for impaired skin integrity will decrease Outcome: Progressing

## 2023-03-06 NOTE — Progress Notes (Signed)
PROGRESS NOTE   Darlene Mcbride  UJW:[id number] DOB: [birth year] DOA: 03/04/2023 PCP: [facility]   Chief Complaint  Patient presents with   Shortness of Breath   Level of care: Med-Surg  Brief Admission History:  32 y.o. female, without significant past medical history, she presents to ED secondary to complaints of cough, shortness of breath, for last 10 days, reports symptoms started last Monday, reports dyspnea, cough, she had some subjective fever, and chills initially, reports her son was sick, but he developed her's sickness 3 days after her initial sickness, denies feeling previously sick like that, reports she is feeling very weak, fatigued as well, she denies sore throat, nasal congestion congestion, chest pain, any history of lung disease, no COPD, no asthma in the past, mother passed away last week due to malignancy, patient reports she is vaping over last year, she has been using tobacco before that, but she reports she did using any new vape, but she has been vaping excessively recently due to stress from her mother death, but she is significantly cut back on vaping due to her illness.  Denies any alcohol or substance abuse -in ED patient was significantly hypoxic 82% on room air, requiring 3 L initially which brought her up to 91, but this has improved with the steroid and nebulizer treatment, she is currently 95% on 3 L, but she remained tachypneic, with some increased work of breathing, proved with steroids, nebulizer treatments, she received IV Rocephin and azithromycin and [facility] hospital[facility] consulted to admit.   Assessment and Plan:  Acute respiratory failure with hypoxia - secondary to pneumonia - continue current treatments - wean oxygen as able to room air  - currently weaned down to 3L/min [place]   Mycoplasma Pneumonia / Walking pneumonia - continue current antibiotics and supportive measures - wean oxygen to room air as able  - consult to RT for support -  added flutter valve  - incentive spirometry added   Pleuritic chest wall pain - from heavy coughing  - schedule acetaminophen for pain  - stronger cough medication ordered - added mucinex 1200 mg BID   Pulmonary Nodules - ambulatory referral to pulmonary nodule clinic sent 10/24  Tobacco Use - counseled on cessation - offered nicotine patch  - pt strongly advised to stop using vaping devices  Leukocytosis  - likely leukemoid reaction from steroids  DVT prophylaxis: enoxaparin  Code Status: Full  Family Communication: bedside update 10/24  Disposition: anticipate home tomorrow    Consultants:   Procedures:   Antimicrobials:  Ceftriaxone 10/23>> Azithromycin 10/23>>   Subjective: Reports ongoing productive cough with pleuritic chest wall pain; requesting tylenol.    Objective: Vitals:   03/06/23 0044 03/06/23 0528 03/06/23 0814 03/06/23 0940  BP:  125/61  (!) 147/93  Pulse:  87  100  Resp:  20  20  Temp:  98 F (36.7 C)  97.6 F (36.4 C)  TempSrc:  Oral  Oral  SpO2: 94% 96% 94% 94%  Weight:      Height:        Intake/Output Summary (Last 24 hours) at 03/06/2023 1258 Last data filed at 03/06/2023 1255 Gross per 24 hour  Intake 970 ml  Output 3 ml  Net 967 ml   Filed Weights   03/04/23 1417  Weight: 99.8 kg   Examination:  General exam: Appears calm and comfortable  Respiratory system: Clear to auscultation. Respiratory effort normal. Cardiovascular system: normal S1 & S2 heard. No JVD, murmurs, rubs,  gallops or clicks. No pedal edema. Gastrointestinal system: Abdomen is nondistended, soft and nontender. No organomegaly or masses felt. Normal bowel sounds heard. Central nervous system: Alert and oriented. No focal neurological deficits. Extremities: Symmetric 5 x 5 power. Skin: No rashes, lesions or ulcers. Psychiatry: Judgement and insight appear normal. Mood & affect appropriate.   Data Reviewed: I have personally reviewed following labs and  imaging studies  CBC: Recent Labs  Lab 03/04/23 1448 03/05/23 0452 03/06/23 0422  WBC 12.0* 12.6* 16.9*  NEUTROABS 9.5*  --   --   HGB 12.3 11.9* 11.3*  HCT 37.8 37.3 34.8*  MCV 93.1 94.2 93.8  PLT 482* 456* 471*    Basic Metabolic Panel: Recent Labs  Lab 03/04/23 1448 03/05/23 0452  NA 136 139  K 4.1 4.0  CL 99 105  CO2 26 24  GLUCOSE 108* 120*  BUN 6 11  CREATININE 0.59 0.63  CALCIUM 8.8* 8.7*    CBG: No results for input(s): "GLUCAP" in the last 168 hours.  Recent Results (from the past 240 hour(s))  Resp panel by RT-PCR (RSV, Flu A&B, Covid) Anterior Nasal Swab     Status: None   Collection Time: 03/04/23  2:35 PM   Specimen: Anterior Nasal Swab  Result Value Ref Range Status   SARS Coronavirus 2 by RT PCR NEGATIVE NEGATIVE Final    Comment: (NOTE) SARS-CoV-2 target nucleic acids are NOT DETECTED.  The SARS-CoV-2 RNA is generally detectable in upper respiratory specimens during the acute phase of infection. The lowest concentration of SARS-CoV-2 viral copies this assay can detect is 138 copies/mL. A negative result does not preclude SARS-Cov-2 infection and should not be used as the sole basis for treatment or other patient management decisions. A negative result may occur with  improper specimen collection/handling, submission of specimen other than nasopharyngeal swab, presence of viral mutation(s) within the areas targeted by this assay, and inadequate number of viral copies(<138 copies/mL). A negative result must be combined with clinical observations, patient history, and epidemiological information. The expected result is Negative.  Fact Sheet for Patients:  BloggerCourse.com  Fact Sheet for Healthcare Providers:  SeriousBroker.it  This test is no t yet approved or cleared by the [place] FDA and  has been authorized for detection and/or diagnosis of SARS-CoV-2 by FDA under an Emergency Use  Authorization (EUA). This EUA will remain  in effect (meaning this test can be used) for the duration of the COVID-19 declaration under Section 564(b)(1) of the Act, 21 U.S.C.section 360bbb-3(b)(1), unless the authorization is terminated  or revoked sooner.       Influenza A by PCR NEGATIVE NEGATIVE Final   Influenza B by PCR NEGATIVE NEGATIVE Final    Comment: (NOTE) The Xpert Xpress SARS-CoV-2/FLU/RSV plus assay is intended as an aid in the diagnosis of influenza from Nasopharyngeal swab specimens and should not be used as a sole basis for treatment. Nasal washings and aspirates are unacceptable for Xpert Xpress SARS-CoV-2/FLU/RSV testing.  Fact Sheet for Patients: BloggerCourse.com  Fact Sheet for Healthcare Providers: SeriousBroker.it  This test is not yet approved or cleared by the [place] FDA and has been authorized for detection and/or diagnosis of SARS-CoV-2 by FDA under an Emergency Use Authorization (EUA). This EUA will remain in effect (meaning this test can be used) for the duration of the COVID-19 declaration under Section 564(b)(1) of the Act, 21 U.S.C. section 360bbb-3(b)(1), unless the authorization is terminated or revoked.     Resp Syncytial Virus by PCR  NEGATIVE NEGATIVE Final    Comment: (NOTE) Fact Sheet for Patients: BloggerCourse.com  Fact Sheet for Healthcare Providers: SeriousBroker.it  This test is not yet approved or cleared by the [place] FDA and has been authorized for detection and/or diagnosis of SARS-CoV-2 by FDA under an Emergency Use Authorization (EUA). This EUA will remain in effect (meaning this test can be used) for the duration of the COVID-19 declaration under Section 564(b)(1) of the Act, 21 U.S.C. section 360bbb-3(b)(1), unless the authorization is terminated or revoked.  Performed at [facility], [address],  [place], [place] [address]   Blood culture (routine x 2)     Status: None (Preliminary result)   Collection Time: 03/04/23  3:45 PM   Specimen: BLOOD  Result Value Ref Range Status   Specimen Description BLOOD LEFT ASSIST CONTROL  Final   Special Requests   Final    BOTTLES DRAWN AEROBIC AND ANAEROBIC Blood Culture adequate volume   Culture   Final    NO GROWTH 2 DAYS Performed at [facility], [address], [place], [place] [address]    Report Status PENDING  Incomplete  Blood culture (routine x 2)     Status: None (Preliminary result)   Collection Time: 03/04/23  4:28 PM   Specimen: Right Antecubital; Blood  Result Value Ref Range Status   Specimen Description RIGHT ANTECUBITAL  Final   Special Requests   Final    BOTTLES DRAWN AEROBIC AND ANAEROBIC Blood Culture results may not be optimal due to an excessive volume of blood received in culture bottles   Culture   Final    NO GROWTH 2 DAYS Performed at [facility], [address], [place], [place] [address]    Report Status PENDING  Incomplete  Respiratory (~20 pathogens) panel by PCR     Status: Abnormal   Collection Time: 03/04/23  9:54 PM   Specimen: Nasopharyngeal Swab; Respiratory  Result Value Ref Range Status   Adenovirus NOT DETECTED NOT DETECTED Final   Coronavirus 229E NOT DETECTED NOT DETECTED Final    Comment: (NOTE) The Coronavirus on the Respiratory Panel, DOES NOT test for the novel  Coronavirus (2019 nCoV)    Coronavirus HKU1 NOT DETECTED NOT DETECTED Final   Coronavirus NL63 NOT DETECTED NOT DETECTED Final   Coronavirus OC43 NOT DETECTED NOT DETECTED Final   Metapneumovirus NOT DETECTED NOT DETECTED Final   Rhinovirus / Enterovirus NOT DETECTED NOT DETECTED Final   Influenza A NOT DETECTED NOT DETECTED Final   Influenza B NOT DETECTED NOT DETECTED Final   Parainfluenza Virus 1 NOT DETECTED NOT DETECTED Final   Parainfluenza Virus 2 NOT DETECTED NOT DETECTED Final   Parainfluenza Virus 3 NOT DETECTED NOT  DETECTED Final   Parainfluenza Virus 4 NOT DETECTED NOT DETECTED Final   Respiratory Syncytial Virus NOT DETECTED NOT DETECTED Final   Bordetella pertussis NOT DETECTED NOT DETECTED Final   Bordetella Parapertussis NOT DETECTED NOT DETECTED Final   Chlamydophila pneumoniae NOT DETECTED NOT DETECTED Final   Mycoplasma pneumoniae DETECTED (A) NOT DETECTED Final    Comment: Performed at [facility], [address], [place], [place] [address]     Radiology Studies: CT Chest Wo Contrast  Result Date: 03/04/2023 CLINICAL DATA:  Respiratory illness, nondiagnostic x-ray. Shortness of breath and cough. EXAM: CT CHEST WITHOUT CONTRAST TECHNIQUE: Multidetector CT imaging of the chest was performed following the standard protocol without IV contrast. RADIATION DOSE REDUCTION: This exam was performed according to the departmental dose-optimization  program which includes automated exposure control, adjustment of the mA and/or kV according to patient size and/or use of iterative reconstruction technique. COMPARISON:  None Available. FINDINGS: Cardiovascular: The heart is normal in size and there is no pericardial effusion. The aorta and pulmonary trunk are normal in caliber. Mediastinum/Nodes: Enlarged lymph nodes are present in the mediastinum measuring up to 1.5 cm. No axillary lymphadenopathy. Evaluation of the hila is limited due to lack of IV contrast. The thyroid gland, trachea, and esophagus are within normal limits. Lungs/Pleura: Centrilobular nodules are present throughout the lungs bilaterally, greater on the left than on the right. No effusion or pneumothorax. 7 mm nodule is present in the left lower lobe, axial image 100. 5 mm nodule is present in the right upper lobe, axial image 75. Mild atelectasis or scarring is noted in the right middle lobe. Upper Abdomen: No acute abnormality. Musculoskeletal: Degenerative changes are present in the thoracic spine. No acute osseous abnormality. IMPRESSION:  1. Multiple centrilobular nodules in the lungs bilaterally, likely infectious or inflammatory. 2. Bilateral pulmonary nodules measuring up to 7 mm. Non-contrast chest CT at 3-6 months is recommended. If the nodules are stable at time of repeat CT, then future CT at 18-24 months (from today's scan) is considered optional for low-risk patients, but is recommended for high-risk patients. This recommendation follows the consensus statement: Guidelines for Management of Incidental Pulmonary Nodules Detected on CT Images: From the Fleischner Society 2017; Radiology 2017; 284:228-243. 3. Enlarged mediastinal lymph nodes, likely reactive. Electronically Signed   By: Thornell Sartorius M.D.   On: 03/04/2023 20:16   DG Chest 2 View  Result Date: 03/04/2023 CLINICAL DATA:  Short of breath and cough, hypoxia EXAM: CHEST - 2 VIEW COMPARISON:  Prior chest x-ray 05/26/2018 FINDINGS: Cardiac and mediastinal contours are within normal limits. Multifocal mild patchy airspace opacities predominantly in the bilateral lower lobes. Fine interstitial prominence. No pleural effusion or pneumothorax. Osseous structures are intact and unremarkable. IMPRESSION: 1. Bilateral lower lobe bronchial wall thickening and reticulonodular airspace opacities. Findings are nonspecific and may represent inhalational lung disease versus acute bronchitis. Electronically Signed   By: Malachy Moan M.D.   On: 03/04/2023 16:53   US Venous Img Lower Bilateral  Result Date: 03/04/2023 CLINICAL DATA:  32 year old female with a history of hypoxia, lower extremity edema EXAM: BILATERAL LOWER EXTREMITY VENOUS DOPPLER ULTRASOUND TECHNIQUE: Gray-scale sonography with graded compression, as well as color Doppler and duplex ultrasound were performed to evaluate the lower extremity deep venous systems from the level of the common femoral vein and including the common femoral, femoral, profunda femoral, popliteal and calf veins including the posterior tibial,  peroneal and gastrocnemius veins when visible. The superficial great saphenous vein was also interrogated. Spectral Doppler was utilized to evaluate flow at rest and with distal augmentation maneuvers in the common femoral, femoral and popliteal veins. COMPARISON:  None Available. FINDINGS: RIGHT LOWER EXTREMITY Common Femoral Vein: No evidence of thrombus. Normal compressibility, respiratory phasicity and response to augmentation. Saphenofemoral Junction: No evidence of thrombus. Normal compressibility and flow on color Doppler imaging. Profunda Femoral Vein: No evidence of thrombus. Normal compressibility and flow on color Doppler imaging. Femoral Vein: No evidence of thrombus. Normal compressibility, respiratory phasicity and response to augmentation. Popliteal Vein: No evidence of thrombus. Normal compressibility, respiratory phasicity and response to augmentation. Calf Veins: No evidence of thrombus. Normal compressibility and flow on color Doppler imaging. Superficial Great Saphenous Vein: No evidence of thrombus. Normal compressibility and flow on color Doppler imaging.  Other Findings:  None. LEFT LOWER EXTREMITY Common Femoral Vein: No evidence of thrombus. Normal compressibility, respiratory phasicity and response to augmentation. Saphenofemoral Junction: No evidence of thrombus. Normal compressibility and flow on color Doppler imaging. Profunda Femoral Vein: No evidence of thrombus. Normal compressibility and flow on color Doppler imaging. Femoral Vein: No evidence of thrombus. Normal compressibility, respiratory phasicity and response to augmentation. Popliteal Vein: No evidence of thrombus. Normal compressibility, respiratory phasicity and response to augmentation. Calf Veins: No evidence of thrombus. Normal compressibility and flow on color Doppler imaging. Superficial Great Saphenous Vein: No evidence of thrombus. Normal compressibility and flow on color Doppler imaging. Other Findings:  None.  IMPRESSION: Directed duplex of the bilateral lower extremity negative for DVT Signed, Yvone Neu. Miachel Roux, RPVI [facility] Electronically Signed   By: Gilmer Mor D.O.   On: 03/04/2023 16:13    Scheduled Meds:  acetaminophen  650 mg Oral Q6H   Or   acetaminophen  650 mg Rectal Q6H   clonazePAM  0.5 mg Oral BID   enoxaparin (LOVENOX) injection  40 mg Subcutaneous Q24H   feeding supplement  237 mL Oral BID BM   ipratropium-albuterol  3 mL Nebulization Q4H   methylPREDNISolone (SOLU-MEDROL) injection  40 mg Intravenous Q12H   pantoprazole  40 mg Oral Daily   Continuous Infusions:  azithromycin Stopped (03/05/23 2240)   cefTRIAXone (ROCEPHIN)  IV 2 g (03/05/23 1818)     LOS: 1 day   Time spent: 40 mins  Nakul Avino Laural Benes, MD How to contact the [facility] Attending or Consulting provider 7A - 7P or covering provider during after hours 7P -7A, for this patient?  Check the care team in [facility] and look for a) attending/consulting TR[facility] provider listed and b) the [facility] team listed Log into [email or web address] to find provider on call.  Locate the [facility] provider you are looking for under [facility] and page to a number that you can be directly reached. If you still have difficulty reaching the provider, please page the [facility] (Director on Call) for the Hospitalists listed on amion for assistance.  03/06/2023, 12:58 PM

## 2023-03-06 NOTE — Progress Notes (Signed)
[place] color at 2200 turned yellow due to increase RR(26) and elevated HR(103). Patient had been coughing before and during vital signs. Provider made aware of cough and cough medication obtained. Rechecked vital signs once coughing calmed down and MEWS color back to green.

## 2023-03-07 DIAGNOSIS — J9601 Acute respiratory failure with hypoxia: Secondary | ICD-10-CM | POA: Diagnosis not present

## 2023-03-07 DIAGNOSIS — J157 Pneumonia due to Mycoplasma pneumoniae: Secondary | ICD-10-CM | POA: Diagnosis not present

## 2023-03-07 LAB — CBC
HCT: 36.1 % (ref 36.0–46.0)
Hemoglobin: 11.5 g/dL — ABNORMAL LOW (ref 12.0–15.0)
MCH: 30 pg (ref 26.0–34.0)
MCHC: 31.9 g/dL (ref 30.0–36.0)
MCV: 94.3 fL (ref 80.0–100.0)
Platelets: 476 10*3/uL — ABNORMAL HIGH (ref 150–400)
RBC: 3.83 MIL/uL — ABNORMAL LOW (ref 3.87–5.11)
RDW: 14.1 % (ref 11.5–15.5)
WBC: 14 10*3/uL — ABNORMAL HIGH (ref 4.0–10.5)
nRBC: 0 % (ref 0.0–0.2)

## 2023-03-07 MED ORDER — PANTOPRAZOLE SODIUM 40 MG PO TBEC: 40.0000 mg | DELAYED_RELEASE_TABLET | Freq: Every day | ORAL | 0 refills | Status: AC

## 2023-03-07 MED ORDER — PREDNISONE 20 MG PO TABS
40.0000 mg | ORAL_TABLET | Freq: Every day | ORAL | 0 refills | Status: AC
Start: 2023-03-08 — End: 2023-03-13

## 2023-03-07 MED ORDER — GUAIFENESIN ER 600 MG PO TB12: 1200.0000 mg | ORAL_TABLET | Freq: Two times a day (BID) | ORAL | 0 refills | Status: AC

## 2023-03-07 MED ORDER — DOXYCYCLINE HYCLATE 100 MG PO CAPS: 100.0000 mg | ORAL_CAPSULE | Freq: Two times a day (BID) | ORAL | 0 refills | Status: AC

## 2023-03-07 MED ORDER — DEXTROMETHORPHAN POLISTIREX ER 30 MG/5ML PO SUER: 30.0000 mg | Freq: Two times a day (BID) | ORAL | 1 refills | Status: AC | PRN

## 2023-03-07 NOTE — TOC Progression Note (Signed)
Transition of Care [facility]) - Progression Note    Patient Details  Name: Darlene Mcbride MRN: [id number] Date of Birth: [birth year]  [facility] CM/SW Contact  Catalina Gravel, LCSW Phone Number: 03/07/2023, 12:48 PM  Clinical Narrative:    CSW contacted by [place]- pt needs home 02.  CSW visited pt at bedside, provided a portable tank educated that 02 lasts about 3 hours and the provider selected will come today for home set up. Pt DC today.    Barriers to Discharge: No Barriers Identified  Expected Discharge Plan and Services         Expected Discharge Date: 03/07/23                                     Social Determinants of Health (SDOH) Interventions SDOH Screenings   Food Insecurity: No Food Insecurity (03/04/2023)  Housing: Patient Declined (03/04/2023)  Transportation Needs: No Transportation Needs (03/04/2023)  Utilities: Not At Risk (03/04/2023)  Tobacco Use: Medium Risk (03/04/2023)    Readmission Risk Interventions     No data to display

## 2023-03-07 NOTE — Discharge Summary (Addendum)
Physician Discharge Summary  Darlene Mcbride WJX:[id number] DOB: [birth year] DOA: 03/04/2023  PCP: [facility]  Admit date: 03/04/2023 Discharge date: 03/07/2023  Admitted From:  HOME  Disposition: HOME   Recommendations for Outpatient Follow-up:  Follow up with PCP in 1 weeks Please follow up with pulmonary nodule clinic as scheduled  Please stop all tobacco use   Home Health:  n/a   Discharge Condition: STABLE   CODE STATUS: FULL DIET: Regular    Brief Hospitalization Summary: Please see all hospital notes, images, labs for full details of the hospitalization. Admission Provider HPI:  32 y.o. female, without significant past medical history, she presents to ED secondary to complaints of cough, shortness of breath, for last 10 days, reports symptoms started last Monday, reports dyspnea, cough, she had some subjective fever, and chills initially, reports her son was sick, but he developed her's sickness 3 days after her initial sickness, denies feeling previously sick like that, reports she is feeling very weak, fatigued as well, she denies sore throat, nasal congestion congestion, chest pain, any history of lung disease, no COPD, no asthma in the past, mother passed away last week due to malignancy, patient reports she is vaping over last year, she has been using tobacco before that, but she reports she did using any new vape, but she has been vaping excessively recently due to stress from her mother death, but she is significantly cut back on vaping due to her illness.  Denies any alcohol or substance abuse -in ED patient was significantly hypoxic 82% on room air, requiring 3 L initially which brought her up to 91, but this has improved with the steroid and nebulizer treatment, she is currently 95% on 3 L, but she remained tachypneic, with some increased work of breathing, proved with steroids, nebulizer treatments, she received IV Rocephin and azithromycin and [facility] hospitalist  consulted to admit.  Hospital course by problem list   Acute respiratory failure with hypoxia - secondary to pneumonia - continue current treatments - wean oxygen as able to room air  - obtain home O2 eval prior to discharge - pt will need home O2 3L/min, DME ordered and wean oxygen with PCP as she recovers   Mycoplasma Pneumonia / Walking pneumonia - treated with IV antibiotics and supportive measures - wean oxygen to room air as able  - consult to RT for support - added flutter valve  - incentive spirometry added  - DC home on oral doxycycline x 5 d to complete course    Pleuritic chest wall pain - from heavy coughing  - schedule acetaminophen for pain  - stronger cough medication ordered - added mucinex 1200 mg BID    Pulmonary Nodules - ambulatory referral to pulmonary nodule clinic sent 10/24   Tobacco Use - counseled on cessation - offered nicotine patch  - pt strongly advised to stop using vaping devices   Leukocytosis  - likely leukemoid reaction from steroids   Discharge Diagnoses:  Principal Problem:   Acute respiratory failure with hypoxia (HCC) Active Problems:   Grief   Mycoplasma pneumonia   Discharge Instructions: Discharge Instructions     AMB  Referral to Pulmonary Nodule Clinic   Complete by: As directed       Allergies as of 03/07/2023       Reactions   Contrast Media [iodinated Contrast Media] Hives        Medication List     STOP taking these medications    ibuprofen 200  MG tablet Commonly known as: ADVIL       TAKE these medications    acetaminophen 500 MG tablet Commonly known as: TYLENOL Take 500 mg by mouth every 6 (six) hours as needed for moderate pain (pain score 4-6).   clonazePAM 0.5 MG tablet Commonly known as: KLONOPIN Take 0.5 mg by mouth 2 (two) times daily.   dextromethorphan 30 MG/5ML liquid Commonly known as: Delsym Take 5 mLs (30 mg total) by mouth 2 (two) times daily as needed for cough.    doxycycline 100 MG capsule Commonly known as: VIBRAMYCIN Take 1 capsule (100 mg total) by mouth 2 (two) times daily for 5 days.   guaiFENesin 600 MG 12 hr tablet Commonly known as: MUCINEX Take 2 tablets (1,200 mg total) by mouth 2 (two) times daily for 3 days.   pantoprazole 40 MG tablet Commonly known as: PROTONIX Take 1 tablet (40 mg total) by mouth daily for 14 days. Start taking on: March 08, 2023   predniSONE 20 MG tablet Commonly known as: DELTASONE Take 2 tablets (40 mg total) by mouth daily with breakfast for 5 days. Start taking on: March 08, 2023        Follow-up Information     [facility]. Schedule an appointment as soon as possible for a visit in 1 week(s).   Why: Hospital Follow Up Contact information: [address] [place] [address] [phone]                Allergies  Allergen Reactions   Contrast Media [Iodinated Contrast Media] Hives   Allergies as of 03/07/2023       Reactions   Contrast Media [iodinated Contrast Media] Hives        Medication List     STOP taking these medications    ibuprofen 200 MG tablet Commonly known as: ADVIL       TAKE these medications    acetaminophen 500 MG tablet Commonly known as: TYLENOL Take 500 mg by mouth every 6 (six) hours as needed for moderate pain (pain score 4-6).   clonazePAM 0.5 MG tablet Commonly known as: KLONOPIN Take 0.5 mg by mouth 2 (two) times daily.   dextromethorphan 30 MG/5ML liquid Commonly known as: Delsym Take 5 mLs (30 mg total) by mouth 2 (two) times daily as needed for cough.   doxycycline 100 MG capsule Commonly known as: VIBRAMYCIN Take 1 capsule (100 mg total) by mouth 2 (two) times daily for 5 days.   guaiFENesin 600 MG 12 hr tablet Commonly known as: MUCINEX Take 2 tablets (1,200 mg total) by mouth 2 (two) times daily for 3 days.   pantoprazole 40 MG tablet Commonly known as: PROTONIX Take 1 tablet (40 mg total) by mouth daily  for 14 days. Start taking on: March 08, 2023   predniSONE 20 MG tablet Commonly known as: DELTASONE Take 2 tablets (40 mg total) by mouth daily with breakfast for 5 days. Start taking on: March 08, 2023        Procedures/Studies: CT Chest Wo Contrast  Result Date: 03/04/2023 CLINICAL DATA:  Respiratory illness, nondiagnostic x-ray. Shortness of breath and cough. EXAM: CT CHEST WITHOUT CONTRAST TECHNIQUE: Multidetector CT imaging of the chest was performed following the standard protocol without IV contrast. RADIATION DOSE REDUCTION: This exam was performed according to the departmental dose-optimization program which includes automated exposure control, adjustment of the mA and/or kV according to patient size and/or use of iterative reconstruction technique. COMPARISON:  None Available.  FINDINGS: Cardiovascular: The heart is normal in size and there is no pericardial effusion. The aorta and pulmonary trunk are normal in caliber. Mediastinum/Nodes: Enlarged lymph nodes are present in the mediastinum measuring up to 1.5 cm. No axillary lymphadenopathy. Evaluation of the hila is limited due to lack of IV contrast. The thyroid gland, trachea, and esophagus are within normal limits. Lungs/Pleura: Centrilobular nodules are present throughout the lungs bilaterally, greater on the left than on the right. No effusion or pneumothorax. 7 mm nodule is present in the left lower lobe, axial image 100. 5 mm nodule is present in the right upper lobe, axial image 75. Mild atelectasis or scarring is noted in the right middle lobe. Upper Abdomen: No acute abnormality. Musculoskeletal: Degenerative changes are present in the thoracic spine. No acute osseous abnormality. IMPRESSION: 1. Multiple centrilobular nodules in the lungs bilaterally, likely infectious or inflammatory. 2. Bilateral pulmonary nodules measuring up to 7 mm. Non-contrast chest CT at 3-6 months is recommended. If the nodules are stable at time of  repeat CT, then future CT at 18-24 months (from today's scan) is considered optional for low-risk patients, but is recommended for high-risk patients. This recommendation follows the consensus statement: Guidelines for Management of Incidental Pulmonary Nodules Detected on CT Images: From the Fleischner Society 2017; Radiology 2017; 284:228-243. 3. Enlarged mediastinal lymph nodes, likely reactive. Electronically Signed   By: Thornell Sartorius M.D.   On: 03/04/2023 20:16   DG Chest 2 View  Result Date: 03/04/2023 CLINICAL DATA:  Short of breath and cough, hypoxia EXAM: CHEST - 2 VIEW COMPARISON:  Prior chest x-ray 05/26/2018 FINDINGS: Cardiac and mediastinal contours are within normal limits. Multifocal mild patchy airspace opacities predominantly in the bilateral lower lobes. Fine interstitial prominence. No pleural effusion or pneumothorax. Osseous structures are intact and unremarkable. IMPRESSION: 1. Bilateral lower lobe bronchial wall thickening and reticulonodular airspace opacities. Findings are nonspecific and may represent inhalational lung disease versus acute bronchitis. Electronically Signed   By: Malachy Moan M.D.   On: 03/04/2023 16:53   US Venous Img Lower Bilateral  Result Date: 03/04/2023 CLINICAL DATA:  32 year old female with a history of hypoxia, lower extremity edema EXAM: BILATERAL LOWER EXTREMITY VENOUS DOPPLER ULTRASOUND TECHNIQUE: Gray-scale sonography with graded compression, as well as color Doppler and duplex ultrasound were performed to evaluate the lower extremity deep venous systems from the level of the common femoral vein and including the common femoral, femoral, profunda femoral, popliteal and calf veins including the posterior tibial, peroneal and gastrocnemius veins when visible. The superficial great saphenous vein was also interrogated. Spectral Doppler was utilized to evaluate flow at rest and with distal augmentation maneuvers in the common femoral, femoral and  popliteal veins. COMPARISON:  None Available. FINDINGS: RIGHT LOWER EXTREMITY Common Femoral Vein: No evidence of thrombus. Normal compressibility, respiratory phasicity and response to augmentation. Saphenofemoral Junction: No evidence of thrombus. Normal compressibility and flow on color Doppler imaging. Profunda Femoral Vein: No evidence of thrombus. Normal compressibility and flow on color Doppler imaging. Femoral Vein: No evidence of thrombus. Normal compressibility, respiratory phasicity and response to augmentation. Popliteal Vein: No evidence of thrombus. Normal compressibility, respiratory phasicity and response to augmentation. Calf Veins: No evidence of thrombus. Normal compressibility and flow on color Doppler imaging. Superficial Great Saphenous Vein: No evidence of thrombus. Normal compressibility and flow on color Doppler imaging. Other Findings:  None. LEFT LOWER EXTREMITY Common Femoral Vein: No evidence of thrombus. Normal compressibility, respiratory phasicity and response to augmentation. Saphenofemoral Junction: No evidence  of thrombus. Normal compressibility and flow on color Doppler imaging. Profunda Femoral Vein: No evidence of thrombus. Normal compressibility and flow on color Doppler imaging. Femoral Vein: No evidence of thrombus. Normal compressibility, respiratory phasicity and response to augmentation. Popliteal Vein: No evidence of thrombus. Normal compressibility, respiratory phasicity and response to augmentation. Calf Veins: No evidence of thrombus. Normal compressibility and flow on color Doppler imaging. Superficial Great Saphenous Vein: No evidence of thrombus. Normal compressibility and flow on color Doppler imaging. Other Findings:  None. IMPRESSION: Directed duplex of the bilateral lower extremity negative for DVT Signed, Yvone Neu. Miachel Roux, RPVI [facility] Electronically Signed   By: Gilmer Mor D.O.   On:  03/04/2023 16:13     Subjective: Pt feeling much better, no SOB or chest pain symptoms.  Ambulating in room.    Discharge Exam: Vitals:   03/07/23 0611 03/07/23 0917  BP: 127/75   Pulse: 88   Resp: 20   Temp: 98.6 F (37 C)   SpO2: 98% 91%   Vitals:   03/06/23 2052 03/07/23 0027 03/07/23 0611 03/07/23 0917  BP: 119/62 127/79 127/75   Pulse: 89 96 88   Resp: (!) 22 20 20    Temp: 98.3 F (36.8 C) 98 F (36.7 C) 98.6 F (37 C)   TempSrc: Oral Oral Oral   SpO2: 95% 95% 98% 91%  Weight:      Height:       General: Pt is alert, awake, not in acute distress Cardiovascular: normal S1/S2 +, no rubs, no gallops Respiratory: CTA bilaterally, no wheezing, no rhonchi Abdominal: Soft, NT, ND, bowel sounds + Extremities: no edema, no cyanosis   The results of significant diagnostics from this hospitalization (including imaging, microbiology, ancillary and laboratory) are listed below for reference.     Microbiology: Recent Results (from the past 240 hour(s))  Resp panel by RT-PCR (RSV, Flu A&B, Covid) Anterior Nasal Swab     Status: None   Collection Time: 03/04/23  2:35 PM   Specimen: Anterior Nasal Swab  Result Value Ref Range Status   SARS Coronavirus 2 by RT PCR NEGATIVE NEGATIVE Final    Comment: (NOTE) SARS-CoV-2 target nucleic acids are NOT DETECTED.  The SARS-CoV-2 RNA is generally detectable in upper respiratory specimens during the acute phase of infection. The lowest concentration of SARS-CoV-2 viral copies this assay can detect is 138 copies/mL. A negative result does not preclude SARS-Cov-2 infection and should not be used as the sole basis for treatment or other patient management decisions. A negative result may occur with  improper specimen collection/handling, submission of specimen other than nasopharyngeal swab, presence of viral mutation(s) within the areas targeted by this assay, and inadequate number of viral copies(<138 copies/mL). A negative result  must be combined with clinical observations, patient history, and epidemiological information. The expected result is Negative.  Fact Sheet for Patients:  BloggerCourse.com  Fact Sheet for Healthcare Providers:  SeriousBroker.it  This test is no t yet approved or cleared by the [place] FDA and  has been authorized for detection and/or diagnosis of SARS-CoV-2 by FDA under an Emergency Use Authorization (EUA). This EUA will remain  in effect (meaning this test can be used) for the duration of the COVID-19 declaration under Section 564(b)(1) of the Act, 21 U.S.C.section 360bbb-3(b)(1), unless the authorization is terminated  or revoked sooner.       Influenza A by PCR NEGATIVE NEGATIVE Final   Influenza B by PCR  NEGATIVE NEGATIVE Final    Comment: (NOTE) The Xpert Xpress SARS-CoV-2/FLU/RSV plus assay is intended as an aid in the diagnosis of influenza from Nasopharyngeal swab specimens and should not be used as a sole basis for treatment. Nasal washings and aspirates are unacceptable for Xpert Xpress SARS-CoV-2/FLU/RSV testing.  Fact Sheet for Patients: BloggerCourse.com  Fact Sheet for Healthcare Providers: SeriousBroker.it  This test is not yet approved or cleared by the [place] FDA and has been authorized for detection and/or diagnosis of SARS-CoV-2 by FDA under an Emergency Use Authorization (EUA). This EUA will remain in effect (meaning this test can be used) for the duration of the COVID-19 declaration under Section 564(b)(1) of the Act, 21 U.S.C. section 360bbb-3(b)(1), unless the authorization is terminated or revoked.     Resp Syncytial Virus by PCR NEGATIVE NEGATIVE Final    Comment: (NOTE) Fact Sheet for Patients: BloggerCourse.com  Fact Sheet for Healthcare Providers: SeriousBroker.it  This test is  not yet approved or cleared by the [place] FDA and has been authorized for detection and/or diagnosis of SARS-CoV-2 by FDA under an Emergency Use Authorization (EUA). This EUA will remain in effect (meaning this test can be used) for the duration of the COVID-19 declaration under Section 564(b)(1) of the Act, 21 U.S.C. section 360bbb-3(b)(1), unless the authorization is terminated or revoked.  Performed at [facility], [address] [place], [place] [address]   Blood culture (routine x 2)     Status: None (Preliminary result)   Collection Time: 03/04/23  3:45 PM   Specimen: BLOOD  Result Value Ref Range Status   Specimen Description BLOOD LEFT ASSIST CONTROL  Final   Special Requests   Final    BOTTLES DRAWN AEROBIC AND ANAEROBIC Blood Culture adequate volume   Culture   Final    NO GROWTH 3 DAYS Performed at [facility], [address] [place], [place] [address]    Report Status PENDING  Incomplete  Blood culture (routine x 2)     Status: None (Preliminary result)   Collection Time: 03/04/23  4:28 PM   Specimen: Right Antecubital; Blood  Result Value Ref Range Status   Specimen Description RIGHT ANTECUBITAL  Final   Special Requests   Final    BOTTLES DRAWN AEROBIC AND ANAEROBIC Blood Culture results may not be optimal due to an excessive volume of blood received in culture bottles   Culture   Final    NO GROWTH 3 DAYS Performed at [facility], [address], [place], [place] [address]    Report Status PENDING  Incomplete  Respiratory (~20 pathogens) panel by PCR     Status: Abnormal   Collection Time: 03/04/23  9:54 PM   Specimen: Nasopharyngeal Swab; Respiratory  Result Value Ref Range Status   Adenovirus NOT DETECTED NOT DETECTED Final   Coronavirus 229E NOT DETECTED NOT DETECTED Final    Comment: (NOTE) The Coronavirus on the Respiratory Panel, DOES NOT test for the novel  Coronavirus (2019 nCoV)    Coronavirus HKU1 NOT DETECTED NOT DETECTED Final    Coronavirus NL63 NOT DETECTED NOT DETECTED Final   Coronavirus OC43 NOT DETECTED NOT DETECTED Final   Metapneumovirus NOT DETECTED NOT DETECTED Final   Rhinovirus / Enterovirus NOT DETECTED NOT DETECTED Final   Influenza A NOT DETECTED NOT DETECTED Final   Influenza B NOT DETECTED NOT DETECTED Final   Parainfluenza Virus 1 NOT DETECTED NOT DETECTED Final   Parainfluenza Virus 2 NOT DETECTED NOT DETECTED Final  Parainfluenza Virus 3 NOT DETECTED NOT DETECTED Final   Parainfluenza Virus 4 NOT DETECTED NOT DETECTED Final   Respiratory Syncytial Virus NOT DETECTED NOT DETECTED Final   Bordetella pertussis NOT DETECTED NOT DETECTED Final   Bordetella Parapertussis NOT DETECTED NOT DETECTED Final   Chlamydophila pneumoniae NOT DETECTED NOT DETECTED Final   Mycoplasma pneumoniae DETECTED (A) NOT DETECTED Final    Comment: Performed at [facility], [address] [place], [place] [address]     Labs: BNP (last 3 results) No results for input(s): "BNP" in the last 8760 hours. Basic Metabolic Panel: Recent Labs  Lab 03/04/23 1448 03/05/23 0452  NA 136 139  K 4.1 4.0  CL 99 105  CO2 26 24  GLUCOSE 108* 120*  BUN 6 11  CREATININE 0.59 0.63  CALCIUM 8.8* 8.7*   Liver Function Tests: Recent Labs  Lab 03/04/23 1448  AST 17  ALT 28  ALKPHOS 91  BILITOT 0.2*  PROT 7.6  ALBUMIN 3.2*   No results for input(s): "LIPASE", "AMYLASE" in the last 168 hours. No results for input(s): "AMMONIA" in the last 168 hours. CBC: Recent Labs  Lab 03/04/23 1448 03/05/23 0452 03/06/23 0422 03/07/23 0512  WBC 12.0* 12.6* 16.9* 14.0*  NEUTROABS 9.5*  --   --   --   HGB 12.3 11.9* 11.3* 11.5*  HCT 37.8 37.3 34.8* 36.1  MCV 93.1 94.2 93.8 94.3  PLT 482* 456* 471* 476*   Cardiac Enzymes: No results for input(s): "CKTOTAL", "CKMB", "CKMBINDEX", "TROPONINI" in the last 168 hours. BNP: Invalid input(s): "POCBNP" CBG: No results for input(s): "GLUCAP" in the last 168  hours. D-Dimer Recent Labs    03/04/23 1628  DDIMER 0.47   Hgb A1c No results for input(s): "HGBA1C" in the last 72 hours. Lipid Profile No results for input(s): "CHOL", "HDL", "LDLCALC", "TRIG", "CHOLHDL", "LDLDIRECT" in the last 72 hours. Thyroid function studies No results for input(s): "TSH", "T4TOTAL", "T3FREE", "THYROIDAB" in the last 72 hours.  Invalid input(s): "FREET3" Anemia work up No results for input(s): "VITAMINB12", "FOLATE", "FERRITIN", "TIBC", "IRON", "RETICCTPCT" in the last 72 hours. Urinalysis    Component Value Date/Time   COLORURINE YELLOW 03/29/2020 1129   APPEARANCEUR HAZY (A) 03/29/2020 1129   LABSPEC 1.020 03/29/2020 1129   PHURINE 5.0 03/29/2020 1129   GLUCOSEU NEGATIVE 03/29/2020 1129   HGBUR LARGE (A) 03/29/2020 1129   BILIRUBINUR NEGATIVE 03/29/2020 1129   KETONESUR NEGATIVE 03/29/2020 1129   PROTEINUR 30 (A) 03/29/2020 1129   UROBILINOGEN 0.2 07/23/2014 1944   NITRITE NEGATIVE 03/29/2020 1129   LEUKOCYTESUR SMALL (A) 03/29/2020 1129   Sepsis Labs Recent Labs  Lab 03/04/23 1448 03/05/23 0452 03/06/23 0422 03/07/23 0512  WBC 12.0* 12.6* 16.9* 14.0*   Microbiology Recent Results (from the past 240 hour(s))  Resp panel by RT-PCR (RSV, Flu A&B, Covid) Anterior Nasal Swab     Status: None   Collection Time: 03/04/23  2:35 PM   Specimen: Anterior Nasal Swab  Result Value Ref Range Status   SARS Coronavirus 2 by RT PCR NEGATIVE NEGATIVE Final    Comment: (NOTE) SARS-CoV-2 target nucleic acids are NOT DETECTED.  The SARS-CoV-2 RNA is generally detectable in upper respiratory specimens during the acute phase of infection. The lowest concentration of SARS-CoV-2 viral copies this assay can detect is 138 copies/mL. A negative result does not preclude SARS-Cov-2 infection and should not be used as the sole basis for treatment or other patient management decisions. A negative result may occur with  improper specimen collection/handling,  submission of specimen other than nasopharyngeal swab, presence of viral mutation(s) within the areas targeted by this assay, and inadequate number of viral copies(<138 copies/mL). A negative result must be combined with clinical observations, patient history, and epidemiological information. The expected result is Negative.  Fact Sheet for Patients:  BloggerCourse.com  Fact Sheet for Healthcare Providers:  SeriousBroker.it  This test is no t yet approved or cleared by the [place] FDA and  has been authorized for detection and/or diagnosis of SARS-CoV-2 by FDA under an Emergency Use Authorization (EUA). This EUA will remain  in effect (meaning this test can be used) for the duration of the COVID-19 declaration under Section 564(b)(1) of the Act, 21 U.S.C.section 360bbb-3(b)(1), unless the authorization is terminated  or revoked sooner.       Influenza A by PCR NEGATIVE NEGATIVE Final   Influenza B by PCR NEGATIVE NEGATIVE Final    Comment: (NOTE) The Xpert Xpress SARS-CoV-2/FLU/RSV plus assay is intended as an aid in the diagnosis of influenza from Nasopharyngeal swab specimens and should not be used as a sole basis for treatment. Nasal washings and aspirates are unacceptable for Xpert Xpress SARS-CoV-2/FLU/RSV testing.  Fact Sheet for Patients: BloggerCourse.com  Fact Sheet for Healthcare Providers: SeriousBroker.it  This test is not yet approved or cleared by the [place] FDA and has been authorized for detection and/or diagnosis of SARS-CoV-2 by FDA under an Emergency Use Authorization (EUA). This EUA will remain in effect (meaning this test can be used) for the duration of the COVID-19 declaration under Section 564(b)(1) of the Act, 21 U.S.C. section 360bbb-3(b)(1), unless the authorization is terminated or revoked.     Resp Syncytial Virus by PCR NEGATIVE  NEGATIVE Final    Comment: (NOTE) Fact Sheet for Patients: BloggerCourse.com  Fact Sheet for Healthcare Providers: SeriousBroker.it  This test is not yet approved or cleared by the [place] FDA and has been authorized for detection and/or diagnosis of SARS-CoV-2 by FDA under an Emergency Use Authorization (EUA). This EUA will remain in effect (meaning this test can be used) for the duration of the COVID-19 declaration under Section 564(b)(1) of the Act, 21 U.S.C. section 360bbb-3(b)(1), unless the authorization is terminated or revoked.  Performed at [facility], [address] [place], [place] [address]   Blood culture (routine x 2)     Status: None (Preliminary result)   Collection Time: 03/04/23  3:45 PM   Specimen: BLOOD  Result Value Ref Range Status   Specimen Description BLOOD LEFT ASSIST CONTROL  Final   Special Requests   Final    BOTTLES DRAWN AEROBIC AND ANAEROBIC Blood Culture adequate volume   Culture   Final    NO GROWTH 3 DAYS Performed at [facility], [address] [place], [place] [address]    Report Status PENDING  Incomplete  Blood culture (routine x 2)     Status: None (Preliminary result)   Collection Time: 03/04/23  4:28 PM   Specimen: Right Antecubital; Blood  Result Value Ref Range Status   Specimen Description RIGHT ANTECUBITAL  Final   Special Requests   Final    BOTTLES DRAWN AEROBIC AND ANAEROBIC Blood Culture results may not be optimal due to an excessive volume of blood received in culture bottles   Culture   Final    NO GROWTH 3 DAYS Performed at [facility], [address] [place], [place] [address]    Report Status PENDING  Incomplete  Respiratory (~20 pathogens)  panel by PCR     Status: Abnormal   Collection Time: 03/04/23  9:54 PM   Specimen: Nasopharyngeal Swab; Respiratory  Result Value Ref Range Status   Adenovirus NOT DETECTED NOT DETECTED Final   Coronavirus 229E NOT  DETECTED NOT DETECTED Final    Comment: (NOTE) The Coronavirus on the Respiratory Panel, DOES NOT test for the novel  Coronavirus (2019 nCoV)    Coronavirus HKU1 NOT DETECTED NOT DETECTED Final   Coronavirus NL63 NOT DETECTED NOT DETECTED Final   Coronavirus OC43 NOT DETECTED NOT DETECTED Final   Metapneumovirus NOT DETECTED NOT DETECTED Final   Rhinovirus / Enterovirus NOT DETECTED NOT DETECTED Final   Influenza A NOT DETECTED NOT DETECTED Final   Influenza B NOT DETECTED NOT DETECTED Final   Parainfluenza Virus 1 NOT DETECTED NOT DETECTED Final   Parainfluenza Virus 2 NOT DETECTED NOT DETECTED Final   Parainfluenza Virus 3 NOT DETECTED NOT DETECTED Final   Parainfluenza Virus 4 NOT DETECTED NOT DETECTED Final   Respiratory Syncytial Virus NOT DETECTED NOT DETECTED Final   Bordetella pertussis NOT DETECTED NOT DETECTED Final   Bordetella Parapertussis NOT DETECTED NOT DETECTED Final   Chlamydophila pneumoniae NOT DETECTED NOT DETECTED Final   Mycoplasma pneumoniae DETECTED (A) NOT DETECTED Final    Comment: Performed at [facility], [address], [place], [place] [address]   Time coordinating discharge:  41 mins   SIGNED:  Standley Dakins, MD  [facility] 03/07/2023, 11:46 AM How to contact the [facility] Attending or Consulting provider 7A - 7P or covering provider during after hours 7P -7A, for this patient?  Check the care team in [facility] and look for a) attending/consulting [facility] provider listed and b) the [facility] team listed Log into [email or web address] and use [place]'s universal password to access. If you do not have the password, please contact the hospital operator. Locate the [facility] provider you are looking for under [facility] and page to a number that you can be directly reached. If you still have difficulty reaching the provider, please page the [facility] (Director on Call) for the Hospitalists listed on amion for assistance.

## 2023-03-07 NOTE — Progress Notes (Signed)
SATURATION QUALIFICATIONS: (This note is used to comply with regulatory documentation for home oxygen)  Patient Saturations on Room Air at Rest = 89-90%  Patient Saturations on Room Air while Ambulating = 86-87% With very little movement, did not even fully stand.   Patient Saturations on ambulation - unable to make it this far due to SOB and significant drop in O2. Needed 3-4L to recover post movement in bed.  Please briefly explain why patient needs home oxygen: Patient is symptomatically SOB at rest without O2, will definitely need with ambulation.

## 2023-03-07 NOTE — Discharge Instructions (Addendum)
We referred you to the pulmonary nodule clinic to have follow up imaging done in 3-6 months.      IMPORTANT INFORMATION: PAY CLOSE ATTENTION   PHYSICIAN DISCHARGE INSTRUCTIONS  Follow with Primary care provider  [facility], [facility]  and other consultants as instructed by your Hospitalist Physician  SEEK MEDICAL CARE OR RETURN TO EMERGENCY ROOM IF SYMPTOMS COME BACK, WORSEN OR NEW PROBLEM DEVELOPS   Please note: You were cared for by a hospitalist during your hospital stay. Every effort will be made to forward records to your primary care provider.  You can request that your primary care provider send for your hospital records if they have not received them.  Once you are discharged, your primary care physician will handle any further medical issues. Please note that NO REFILLS for any discharge medications will be authorized once you are discharged, as it is imperative that you return to your primary care physician (or establish a relationship with a primary care physician if you do not have one) for your post hospital discharge needs so that they can reassess your need for medications and monitor your lab values.  Please get a complete blood count and chemistry panel checked by your Primary MD at your next visit, and again as instructed by your Primary MD.  Get Medicines reviewed and adjusted: Please take all your medications with you for your next visit with your Primary MD  Laboratory/radiological data: Please request your Primary MD to go over all hospital tests and procedure/radiological results at the follow up, please ask your primary care provider to get all Hospital records sent to his/her office.  In some cases, they will be blood work, cultures and biopsy results pending at the time of your discharge. Please request that your primary care provider follow up on these results.  If you are diabetic, please bring your blood sugar readings with you to your follow up appointment  with primary care.    Please call and make your follow up appointments as soon as possible.    Also Note the following: If you experience worsening of your admission symptoms, develop shortness of breath, life threatening emergency, suicidal or homicidal thoughts you must seek medical attention immediately by calling 911 or calling your MD immediately  if symptoms less severe.  You must read complete instructions/literature along with all the possible adverse reactions/side effects for all the Medicines you take and that have been prescribed to you. Take any new Medicines after you have completely understood and accpet all the possible adverse reactions/side effects.   Do not drive when taking Pain medications or sleeping medications (Benzodiazepines)  Do not take more than prescribed Pain, Sleep and Anxiety Medications. It is not advisable to combine anxiety,sleep and pain medications without talking with your primary care practitioner  Special Instructions: If you have smoked or chewed Tobacco  in the last 2 yrs please stop smoking, stop any regular Alcohol  and or any Recreational drug use.  Wear Seat belts while driving.  Do not drive if taking any narcotic, mind altering or controlled substances or recreational drugs or alcohol.

## 2023-03-07 NOTE — Plan of Care (Signed)
Rested well during the night. Remains on 2l oxygen via nasal cannula.  Goal: Knowledge of General Education information will improve Description: Including pain rating scale, medication(s)/side effects and non-pharmacologic comfort measures Outcome: Progressing   Problem: Health Behavior/Discharge Planning: Goal: Ability to manage health-related needs will improve Outcome: Progressing   Problem: Clinical Measurements: Goal: Ability to maintain clinical measurements within normal limits will improve Outcome: Progressing Goal: Will remain free from infection Outcome: Progressing Goal: Diagnostic test results will improve Outcome: Progressing Goal: Respiratory complications will improve Outcome: Progressing Goal: Cardiovascular complication will be avoided Outcome: Progressing   Problem: Activity: Goal: Risk for activity intolerance will decrease Outcome: Progressing   Problem: Nutrition: Goal: Adequate nutrition will be maintained Outcome: Progressing   Problem: Coping: Goal: Level of anxiety will decrease Outcome: Progressing   Problem: Elimination: Goal: Will not experience complications related to bowel motility Outcome: Progressing Goal: Will not experience complications related to urinary retention Outcome: Progressing   Problem: Pain Management: Goal: General experience of comfort will improve Outcome: Progressing   Problem: Safety: Goal: Ability to remain free from injury will improve Outcome: Progressing   Problem: Skin Integrity: Goal: Risk for impaired skin integrity will decrease Outcome: Progressing   Problem: Education: Goal: Knowledge of disease or condition will improve Outcome: Progressing Goal: Knowledge of the prescribed therapeutic regimen will improve Outcome: Progressing Goal: Individualized Educational Video(s) Outcome: Progressing   Problem: Activity: Goal: Ability to tolerate increased activity will improve Outcome:  Progressing Goal: Will verbalize the importance of balancing activity with adequate rest periods Outcome: Progressing   Problem: Respiratory: Goal: Ability to maintain a clear airway will improve Outcome: Progressing Goal: Levels of oxygenation will improve Outcome: Progressing Goal: Ability to maintain adequate ventilation will improve Outcome: Progressing   Problem: Activity: Goal: Ability to tolerate increased activity will improve Outcome: Progressing   Problem: Clinical Measurements: Goal: Ability to maintain a body temperature in the normal range will improve Outcome: Progressing   Problem: Respiratory: Goal: Ability to maintain adequate ventilation will improve Outcome: Progressing Goal: Ability to maintain a clear airway will improve Outcome: Progressing  Rested

## 2023-03-09 LAB — CULTURE, BLOOD (ROUTINE X 2)
Culture: NO GROWTH
Culture: NO GROWTH
Special Requests: ADEQUATE

## 2023-03-19 ENCOUNTER — Encounter (INDEPENDENT_AMBULATORY_CARE_PROVIDER_SITE_OTHER): Payer: Self-pay | Admitting: *Deleted

## 2023-03-19 ENCOUNTER — Other Ambulatory Visit (HOSPITAL_COMMUNITY): Payer: Self-pay | Admitting: Family Medicine

## 2023-03-19 DIAGNOSIS — N6002 Solitary cyst of left breast: Secondary | ICD-10-CM

## 2023-03-19 DIAGNOSIS — N6001 Solitary cyst of right breast: Secondary | ICD-10-CM

## 2023-04-02 ENCOUNTER — Ambulatory Visit (INDEPENDENT_AMBULATORY_CARE_PROVIDER_SITE_OTHER): Payer: Medicaid Other | Admitting: Gastroenterology

## 2023-04-02 ENCOUNTER — Encounter (INDEPENDENT_AMBULATORY_CARE_PROVIDER_SITE_OTHER): Payer: Self-pay | Admitting: *Deleted

## 2023-04-28 ENCOUNTER — Encounter (HOSPITAL_COMMUNITY): Payer: Self-pay

## 2023-04-28 ENCOUNTER — Ambulatory Visit (HOSPITAL_COMMUNITY)
Admission: RE | Admit: 2023-04-28 | Discharge: 2023-04-28 | Disposition: A | Discharge status: Home / Self Care | Payer: Medicaid Other | Source: Ambulatory Visit | Attending: Family Medicine | Admitting: Family Medicine

## 2023-04-28 ENCOUNTER — Ambulatory Visit (HOSPITAL_COMMUNITY)
Admission: RE | Admit: 2023-04-28 | Discharge: 2023-04-28 | Disposition: A | Discharge status: Home / Self Care | Payer: Medicaid Other | Source: Ambulatory Visit | Attending: Family Medicine

## 2023-04-28 DIAGNOSIS — N6002 Solitary cyst of left breast: Secondary | ICD-10-CM

## 2023-04-28 DIAGNOSIS — N6001 Solitary cyst of right breast: Secondary | ICD-10-CM | POA: Diagnosis present

## 2023-05-19 ENCOUNTER — Ambulatory Visit (INDEPENDENT_AMBULATORY_CARE_PROVIDER_SITE_OTHER): Payer: Medicaid Other | Admitting: Gastroenterology

## 2023-05-20 ENCOUNTER — Encounter (INDEPENDENT_AMBULATORY_CARE_PROVIDER_SITE_OTHER): Payer: Self-pay | Admitting: *Deleted

## 2023-09-17 ENCOUNTER — Encounter (HOSPITAL_COMMUNITY): Payer: Self-pay | Admitting: Family Medicine

## 2023-09-23 ENCOUNTER — Encounter (HOSPITAL_COMMUNITY): Payer: Self-pay | Admitting: Family Medicine

## 2023-09-24 ENCOUNTER — Other Ambulatory Visit (HOSPITAL_COMMUNITY): Payer: Self-pay | Admitting: Family Medicine

## 2023-09-24 DIAGNOSIS — N63 Unspecified lump in unspecified breast: Secondary | ICD-10-CM

## 2023-11-03 ENCOUNTER — Ambulatory Visit (HOSPITAL_COMMUNITY)
Admission: RE | Admit: 2023-11-03 | Discharge: 2023-11-03 | Disposition: A | Discharge status: Home / Self Care | Source: Ambulatory Visit | Attending: Family Medicine | Admitting: Family Medicine

## 2023-11-03 ENCOUNTER — Ambulatory Visit (HOSPITAL_COMMUNITY)
Admission: RE | Admit: 2023-11-03 | Discharge: 2023-11-03 | Disposition: A | Discharge status: Home / Self Care | Source: Ambulatory Visit | Attending: Family Medicine

## 2023-11-03 ENCOUNTER — Encounter (HOSPITAL_COMMUNITY): Payer: Self-pay

## 2023-11-03 DIAGNOSIS — N63 Unspecified lump in unspecified breast: Secondary | ICD-10-CM | POA: Diagnosis present

## 2024-02-24 ENCOUNTER — Encounter (INDEPENDENT_AMBULATORY_CARE_PROVIDER_SITE_OTHER): Payer: Self-pay | Admitting: Gastroenterology

## 2024-04-04 ENCOUNTER — Other Ambulatory Visit (HOSPITAL_COMMUNITY): Payer: Self-pay | Admitting: Family Medicine

## 2024-04-04 DIAGNOSIS — R928 Other abnormal and inconclusive findings on diagnostic imaging of breast: Secondary | ICD-10-CM

## 2024-04-20 ENCOUNTER — Ambulatory Visit: Admitting: Internal Medicine

## 2024-04-20 NOTE — Progress Notes (Deleted)
° °  Darlene Mcbride, female    DOB: 1991/02/12    MRN: [id number]   Brief patient profile:  32  yo*** *** referred to pulmonary clinic in [place]  04/20/2024 by *** for ***    Pt not previously seen by PCCM service.     History of Present Illness  04/20/2024  Pulmonary/ 1st office eval/ Darlene Mcbride / [place] [facility]  No chief complaint on file.    Dyspnea:  *** Cough: *** Sleep: *** SABA use: *** 02: *** LDSCT:***  No obvious day to day or daytime pattern/variability or assoc excess/ purulent sputum or mucus plugs or hemoptysis or cp or chest tightness, subjective wheeze or overt sinus or hb symptoms.    Also denies any obvious fluctuation of symptoms with weather or environmental changes or other aggravating or alleviating factors except as outlined above   No unusual exposure hx or h/o childhood pna/ asthma or knowledge of premature birth.  Current Allergies, Complete Past Medical History, Past Surgical History, Family History, and Social History were reviewed in [employer] record.  ROS  The following are not active complaints unless bolded Hoarseness, sore throat, dysphagia, dental problems, itching, sneezing,  nasal congestion or discharge of excess mucus or purulent secretions, ear ache,   fever, chills, sweats, unintended wt loss or wt gain, classically pleuritic or exertional cp,  orthopnea pnd or arm/hand swelling  or leg swelling, presyncope, palpitations, abdominal pain, anorexia, nausea, vomiting, diarrhea  or change in bowel habits or change in bladder habits, change in stools or change in urine, dysuria, hematuria,  rash, arthralgias, visual complaints, headache, numbness, weakness or ataxia or problems with walking or coordination,  change in mood or  memory.            Outpatient Medications Prior to Visit  Medication Sig Dispense Refill   acetaminophen  (TYLENOL ) 500 MG tablet Take 500 mg by mouth every 6 (six) hours as needed for moderate pain  (pain score 4-6).     clonazePAM  (KLONOPIN ) 0.5 MG tablet Take 0.5 mg by mouth 2 (two) times daily.     dextromethorphan  (DELSYM ) 30 MG/5ML liquid Take 5 mLs (30 mg total) by mouth 2 (two) times daily as needed for cough. 89 mL 1   pantoprazole  (PROTONIX ) 40 MG tablet Take 1 tablet (40 mg total) by mouth daily for 14 days. 14 tablet 0   No facility-administered medications prior to visit.    Past Medical History:  Diagnosis Date   Anxiety    Borderline personality disorder (HCC)    Depression    Kidney stones    Mental disorder    Vaginal Pap smear, abnormal       Objective:     There were no vitals taken for this visit.         Assessment

## 2024-04-28 ENCOUNTER — Ambulatory Visit (HOSPITAL_COMMUNITY): Admission: RE | Admit: 2024-04-28

## 2024-04-28 ENCOUNTER — Inpatient Hospital Stay (HOSPITAL_COMMUNITY): Admission: RE | Admit: 2024-04-28 | Discharge: 2024-04-28 | Attending: Family Medicine

## 2024-04-28 ENCOUNTER — Ambulatory Visit (HOSPITAL_COMMUNITY): Admission: RE | Admit: 2024-04-28 | Discharge: 2024-04-28 | Attending: Family Medicine

## 2024-04-28 DIAGNOSIS — R928 Other abnormal and inconclusive findings on diagnostic imaging of breast: Secondary | ICD-10-CM

## 2024-05-01 ENCOUNTER — Other Ambulatory Visit: Payer: Self-pay | Admitting: Medical Genetics

## 2024-05-02 ENCOUNTER — Other Ambulatory Visit (HOSPITAL_COMMUNITY)

## 2024-05-17 ENCOUNTER — Other Ambulatory Visit (HOSPITAL_COMMUNITY)

## 2024-05-18 ENCOUNTER — Other Ambulatory Visit (HOSPITAL_COMMUNITY)

## 2024-06-08 ENCOUNTER — Other Ambulatory Visit: Payer: Self-pay | Admitting: Medical Genetics

## 2024-06-08 DIAGNOSIS — Z006 Encounter for examination for normal comparison and control in clinical research program: Secondary | ICD-10-CM
